# Patient Record
Sex: Female | Born: 1990 | Race: Black or African American | Hispanic: No | Marital: Single | Smoking: Never smoker
Health system: Southern US, Community
[De-identification: ages and names within clinical notes are randomized; demographics above are authoritative.]

## PROBLEM LIST (undated history)

## (undated) ENCOUNTER — Inpatient Hospital Stay (HOSPITAL_COMMUNITY): Payer: Self-pay

## (undated) ENCOUNTER — Emergency Department (HOSPITAL_COMMUNITY): Admission: EM | Payer: BLUE CROSS/BLUE SHIELD | Source: Home / Self Care

## (undated) DIAGNOSIS — S161XXS Strain of muscle, fascia and tendon at neck level, sequela: Secondary | ICD-10-CM

## (undated) DIAGNOSIS — N946 Dysmenorrhea, unspecified: Secondary | ICD-10-CM

## (undated) DIAGNOSIS — M5030 Other cervical disc degeneration, unspecified cervical region: Secondary | ICD-10-CM

## (undated) DIAGNOSIS — M51379 Other intervertebral disc degeneration, lumbosacral region without mention of lumbar back pain or lower extremity pain: Secondary | ICD-10-CM

## (undated) DIAGNOSIS — M519 Unspecified thoracic, thoracolumbar and lumbosacral intervertebral disc disorder: Secondary | ICD-10-CM

## (undated) DIAGNOSIS — N920 Excessive and frequent menstruation with regular cycle: Secondary | ICD-10-CM

## (undated) DIAGNOSIS — Z789 Other specified health status: Secondary | ICD-10-CM

## (undated) HISTORY — DX: Strain of muscle, fascia and tendon at neck level, sequela: S16.1XXS

## (undated) HISTORY — PX: NECK SURGERY: SHX720

## (undated) HISTORY — PX: NO PAST SURGERIES: SHX2092

## (undated) HISTORY — PX: BACK SURGERY: SHX140

---

## 2016-06-27 ENCOUNTER — Other Ambulatory Visit: Payer: Self-pay | Admitting: Chiropractor

## 2016-06-27 DIAGNOSIS — M5416 Radiculopathy, lumbar region: Secondary | ICD-10-CM

## 2016-06-27 DIAGNOSIS — M5412 Radiculopathy, cervical region: Secondary | ICD-10-CM

## 2016-06-30 ENCOUNTER — Ambulatory Visit
Admission: RE | Admit: 2016-06-30 | Discharge: 2016-06-30 | Disposition: A | Discharge status: Home / Self Care | Payer: Self-pay | Source: Ambulatory Visit | Attending: Chiropractor | Admitting: Chiropractor

## 2016-06-30 DIAGNOSIS — M5412 Radiculopathy, cervical region: Secondary | ICD-10-CM

## 2016-06-30 DIAGNOSIS — M5416 Radiculopathy, lumbar region: Secondary | ICD-10-CM

## 2016-07-31 ENCOUNTER — Encounter: Payer: Self-pay | Admitting: Neurology

## 2016-07-31 ENCOUNTER — Ambulatory Visit (INDEPENDENT_AMBULATORY_CARE_PROVIDER_SITE_OTHER): Payer: Medicaid Other | Admitting: Neurology

## 2016-07-31 VITALS — BP 129/80 | HR 86 | Ht 60.0 in | Wt 159.5 lb

## 2016-07-31 DIAGNOSIS — S161XXS Strain of muscle, fascia and tendon at neck level, sequela: Secondary | ICD-10-CM

## 2016-07-31 HISTORY — DX: Strain of muscle, fascia and tendon at neck level, sequela: S16.1XXS
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
## 2016-12-12 ENCOUNTER — Other Ambulatory Visit (HOSPITAL_COMMUNITY): Payer: Self-pay | Admitting: Nurse Practitioner

## 2016-12-12 DIAGNOSIS — Z3682 Encounter for antenatal screening for nuchal translucency: Secondary | ICD-10-CM

## 2016-12-12 LAB — OB RESULTS CONSOLE HEPATITIS B SURFACE ANTIGEN: HEP B S AG: NEGATIVE

## 2016-12-12 LAB — HEMOGLOBIN EVAL RFX ELECTROPHORESIS
Cystic Fibrosis Profile: NEGATIVE
Drug Screen, Urine: NEGATIVE
Glucose 1 Hour: 153
Hemoglobin Evaluation: NORMAL
Pap: NEGATIVE
Urine Culture, OB: NEGATIVE

## 2016-12-12 LAB — OB RESULTS CONSOLE RPR: RPR: NONREACTIVE

## 2016-12-12 LAB — OB RESULTS CONSOLE ABO/RH: RH TYPE: POSITIVE

## 2016-12-12 LAB — OB RESULTS CONSOLE PLATELET COUNT: Platelets: 307

## 2016-12-12 LAB — OB RESULTS CONSOLE GC/CHLAMYDIA
Chlamydia: NEGATIVE
Gonorrhea: NEGATIVE

## 2016-12-12 LAB — OB RESULTS CONSOLE HGB/HCT, BLOOD
HCT: 38
Hemoglobin: 12.2

## 2016-12-12 LAB — OB RESULTS CONSOLE HIV ANTIBODY (ROUTINE TESTING): HIV: NONREACTIVE

## 2016-12-12 LAB — OB RESULTS CONSOLE ANTIBODY SCREEN: ANTIBODY SCREEN: NEGATIVE

## 2016-12-12 LAB — OB RESULTS CONSOLE RUBELLA ANTIBODY, IGM: Rubella: IMMUNE

## 2016-12-12 LAB — OB RESULTS CONSOLE VARICELLA ZOSTER ANTIBODY, IGG: VARICELLA IGG: IMMUNE

## 2016-12-17 ENCOUNTER — Encounter (HOSPITAL_COMMUNITY): Payer: Self-pay | Admitting: *Deleted

## 2016-12-19 ENCOUNTER — Encounter (HOSPITAL_COMMUNITY): Payer: Self-pay

## 2016-12-19 ENCOUNTER — Ambulatory Visit (HOSPITAL_COMMUNITY)
Admission: RE | Admit: 2016-12-19 | Discharge: 2016-12-19 | Disposition: A | Discharge status: Home / Self Care | Payer: Medicaid Other | Source: Ambulatory Visit | Attending: Nurse Practitioner | Admitting: Nurse Practitioner

## 2016-12-19 DIAGNOSIS — Z3682 Encounter for antenatal screening for nuchal translucency: Secondary | ICD-10-CM | POA: Diagnosis not present

## 2016-12-19 DIAGNOSIS — Z3A13 13 weeks gestation of pregnancy: Secondary | ICD-10-CM | POA: Insufficient documentation

## 2016-12-19 HISTORY — DX: Other specified health status: Z78.9

## 2016-12-19 LAB — GLUCOSE, 3 HOUR GESTATIONAL

## 2016-12-23 ENCOUNTER — Other Ambulatory Visit: Payer: Self-pay

## 2017-02-08 ENCOUNTER — Inpatient Hospital Stay (HOSPITAL_COMMUNITY)
Admission: AD | Admit: 2017-02-08 | Discharge: 2017-02-08 | Disposition: A | Discharge status: Home / Self Care | Payer: Medicaid Other | Source: Ambulatory Visit | Attending: Obstetrics & Gynecology | Admitting: Obstetrics & Gynecology

## 2017-02-08 ENCOUNTER — Encounter (HOSPITAL_COMMUNITY): Payer: Self-pay

## 2017-02-08 DIAGNOSIS — K219 Gastro-esophageal reflux disease without esophagitis: Secondary | ICD-10-CM | POA: Insufficient documentation

## 2017-02-08 DIAGNOSIS — O211 Hyperemesis gravidarum with metabolic disturbance: Secondary | ICD-10-CM

## 2017-02-08 DIAGNOSIS — E860 Dehydration: Secondary | ICD-10-CM

## 2017-02-08 DIAGNOSIS — O99612 Diseases of the digestive system complicating pregnancy, second trimester: Secondary | ICD-10-CM | POA: Diagnosis not present

## 2017-02-08 DIAGNOSIS — Z3A22 22 weeks gestation of pregnancy: Secondary | ICD-10-CM | POA: Diagnosis not present

## 2017-02-08 DIAGNOSIS — R112 Nausea with vomiting, unspecified: Secondary | ICD-10-CM | POA: Diagnosis present

## 2017-02-08 LAB — URINALYSIS, ROUTINE W REFLEX MICROSCOPIC
Bilirubin Urine: NEGATIVE
Glucose, UA: NEGATIVE mg/dL
Hgb urine dipstick: NEGATIVE
Ketones, ur: 80 mg/dL — AB
Nitrite: NEGATIVE
PROTEIN: 30 mg/dL — AB
Specific Gravity, Urine: 1.024 (ref 1.005–1.030)
pH: 5 (ref 5.0–8.0)

## 2017-02-08 LAB — COMPREHENSIVE METABOLIC PANEL
ALBUMIN: 3 g/dL — AB (ref 3.5–5.0)
ALK PHOS: 52 U/L (ref 38–126)
ALT: 28 U/L (ref 14–54)
AST: 22 U/L (ref 15–41)
Anion gap: 8 (ref 5–15)
BILIRUBIN TOTAL: 0.3 mg/dL (ref 0.3–1.2)
BUN: 6 mg/dL (ref 6–20)
CHLORIDE: 104 mmol/L (ref 101–111)
CO2: 22 mmol/L (ref 22–32)
CREATININE: 0.58 mg/dL (ref 0.44–1.00)
Calcium: 8.5 mg/dL — ABNORMAL LOW (ref 8.9–10.3)
GFR calc Af Amer: >60 mL/min (ref >60)
GLUCOSE: 269 mg/dL — AB (ref 65–99)
Potassium: 3.3 mmol/L — ABNORMAL LOW (ref 3.5–5.1)
SODIUM: 134 mmol/L — AB (ref 135–145)
Total Protein: 6.2 g/dL — ABNORMAL LOW (ref 6.5–8.1)

## 2017-02-08 LAB — CBC WITH DIFFERENTIAL/PLATELET
BASOS ABS: 0 10*3/uL (ref 0.0–0.1)
Basophils Relative: 0 %
EOS PCT: 0 %
Eosinophils Absolute: 0 10*3/uL (ref 0.0–0.7)
HEMATOCRIT: 30.2 % — AB (ref 36.0–46.0)
HEMOGLOBIN: 10 g/dL — AB (ref 12.0–15.0)
LYMPHS PCT: 18 %
Lymphs Abs: 2 10*3/uL (ref 0.7–4.0)
MCH: 27.1 pg (ref 26.0–34.0)
MCHC: 33.1 g/dL (ref 30.0–36.0)
MCV: 81.8 fL (ref 78.0–100.0)
Monocytes Absolute: 0.3 10*3/uL (ref 0.1–1.0)
Monocytes Relative: 2 %
NEUTROS ABS: 8.5 10*3/uL — AB (ref 1.7–7.7)
NEUTROS PCT: 80 %
PLATELETS: 287 10*3/uL (ref 150–400)
RBC: 3.69 MIL/uL — AB (ref 3.87–5.11)
RDW: 13.5 % (ref 11.5–15.5)
WBC: 10.8 10*3/uL — AB (ref 4.0–10.5)

## 2017-02-08 MED ORDER — METOCLOPRAMIDE HCL 10 MG PO TABS: 10.0000 mg | ORAL_TABLET | Freq: Three times a day (TID) | ORAL | 1 refills | Status: DC

## 2017-02-08 MED ORDER — DEXTROSE 5 % IN LACTATED RINGERS IV BOLUS
1000.0000 mL | Freq: Once | INTRAVENOUS | Status: AC
  Administered 2017-02-08: 1000 mL via INTRAVENOUS

## 2017-02-08 MED ORDER — SODIUM CHLORIDE 0.9 % IV SOLN
8.0000 mg | Freq: Once | INTRAVENOUS | Status: AC
  Administered 2017-02-08: 8 mg via INTRAVENOUS
  Filled 2017-02-08: qty 4

## 2017-02-08 MED ORDER — METOCLOPRAMIDE HCL 5 MG/ML IJ SOLN
10.0000 mg | Freq: Once | INTRAMUSCULAR | Status: AC
  Administered 2017-02-08: 10 mg via INTRAVENOUS
  Filled 2017-02-08: qty 2

## 2017-02-08 MED ORDER — M.V.I. ADULT IV INJ
Freq: Once | INTRAVENOUS | Status: AC
  Administered 2017-02-08: 11:00:00 via INTRAVENOUS
  Filled 2017-02-08: qty 1000

## 2017-02-08 MED ORDER — RANITIDINE HCL 150 MG PO TABS: 150.0000 mg | ORAL_TABLET | Freq: Two times a day (BID) | ORAL | 1 refills | Status: DC

## 2017-02-08 MED ORDER — FAMOTIDINE IN NACL 20-0.9 MG/50ML-% IV SOLN
20.0000 mg | Freq: Once | INTRAVENOUS | Status: AC
  Administered 2017-02-08: 20 mg via INTRAVENOUS
  Filled 2017-02-08: qty 50

## 2017-02-08 MED ORDER — ONDANSETRON 8 MG PO TBDP: 8.0000 mg | ORAL_TABLET | Freq: Every day | ORAL | 1 refills | Status: DC

## 2017-02-08 NOTE — Discharge Instructions (Signed)
Eating Plan for Hyperemesis Gravidarum °Hyperemesis gravidarum is a severe form of morning sickness. Because this condition causes severe nausea and vomiting, it can lead to dehydration, malnutrition, and weight loss. One way to lessen the symptoms of nausea and vomiting is to follow the eating plan for hyperemesis gravidarum. It is often used along with prescribed medicines to control your symptoms. °What can I do to relieve my symptoms? °Listen to your body. Everyone is different and has different preferences. Find what works best for you. Take any of the following actions that are helpful to you: °· Eat and drink slowly. °· Eat 5-6 small meals daily instead of 3 large meals. °· Eat crackers before you get out of bed in the morning. °· Try having a snack in the middle of the night. °· Starchy foods are usually tolerated well. Examples include cereal, toast, bread, potatoes, pasta, rice, and pretzels. °· Ginger may help with nausea. Add ¼ tsp ground ginger to hot tea or choose ginger tea. °· Try drinking 100% fruit juice or an electrolyte drink. An electrolyte drink contains sodium, potassium, and chloride. °· Continue to take your prenatal vitamins as told by your health care provider. If you are having trouble taking your prenatal vitamins, talk with your health care provider about different options. °· Include at least 1 serving of protein with your meals and snacks. Protein options include meats or poultry, beans, nuts, eggs, and yogurt. Try eating a protein-rich snack before bed. Examples of these snacks include cheese and crackers or half of a peanut butter or turkey sandwich. °· Consider eliminating foods that trigger your symptoms. These may include spicy foods, coffee, high-fat foods, very sweet foods, and acidic foods. °· Try meals that have more protein combined with bland, salty, lower-fat, and dry foods, such as nuts, seeds, pretzels, crackers, and cereal. °· Talk with your healthcare provider about  starting a supplement of vitamin B6. °· Have fluids that are cold, clear, and carbonated or sour. Examples include lemonade, ginger ale, lemon-lime soda, ice water, and sparkling water. °· Try lemon or mint tea. °· Try brushing your teeth or using a mouth rinse after meals. ° °What should I avoid to reduce my symptoms? °Avoiding some of the following things may help reduce your symptoms. °· Foods with strong smells. Try eating meals in well-ventilated areas that are free of odors. °· Drinking water or other beverages with meals. Try not to drink anything during the 30 minutes before and after your meals. °· Drinking more than 1 cup of fluid at a time. Sometimes using a straw helps. °· Fried or high-fat foods, such as butter and cream sauces. °· Spicy foods. °· Skipping meals as best as you can. Nausea can be more intense on an empty stomach. If you cannot tolerate food at that time, do not force it. Try sucking on ice chips or other frozen items, and make up for missed calories later. °· Lying down within 2 hours after eating. °· Environmental triggers. These may include smoky rooms, closed spaces, rooms with strong smells, warm or humid places, overly loud and noisy rooms, and rooms with motion or flickering lights. °· Quick and sudden changes in your movement. ° °This information is not intended to replace advice given to you by your health care provider. Make sure you discuss any questions you have with your health care provider. °Document Released: 02/17/2007 Document Revised: 12/20/2015 Document Reviewed: 11/21/2015 °Elsevier Interactive Patient Education © 2018 [employer] ° °

## 2017-02-08 NOTE — MAU Provider Note (Signed)
History     CSN: [id number]  Arrival date and time: 02/08/17 4196   First Provider Initiated Contact with Patient 02/08/17 0930      Chief Complaint  Patient presents with  . Abdominal Pain  . Emesis   HPI  [name] is a 26 y.o. female [phone] here with ongoing N/V. States the symptoms have been present since she found out she was pregnant. States she is vomiting 5-6 times per day, every day. At times more than that. States she is taking Zofran 1 X per day, and prilosec 1 X per day and Vitamin B, and Ranadinine 2 X per day. States she is unable to keep down food or drinks.    OB History    Gravida Para Term Preterm AB Living   5 4 4     4    SAB TAB Ectopic Multiple Live Births                  Past Medical History:  Diagnosis Date  . Medical history non-contributory     Past Surgical History:  Procedure Laterality Date  . NO PAST SURGERIES      History reviewed. No pertinent family history.  Social History  Substance Use Topics  . Smoking status: Never Smoker  . Smokeless tobacco: Never Used  . Alcohol use No    Allergies: No Known Allergies  Prescriptions Prior to Admission  Medication Sig Dispense Refill Last Dose  . gabapentin (NEURONTIN) 100 MG capsule Take 100 mg by mouth at bedtime.   Not Taking  . Prenatal Vit w/Fe-Methylfol-FA (PNV PO) Take by mouth.   Taking  . sulindac (CLINORIL) 150 MG tablet Take 150 mg by mouth daily.   Not Taking  . tiZANidine (ZANAFLEX) 2 MG tablet Take 2 mg by mouth 2 (two) times daily.   Not Taking  . UNKNOWN TO PATIENT Patient taking a steroid, pain medication and anti-inflammatory but cannot remember the name   Not Taking   Results for orders placed or performed during the hospital encounter of 02/08/17 (from the past 48 hour(s))  Urinalysis, Routine w reflex microscopic     Status: Abnormal   Collection Time: 02/08/17  9:00 AM  Result Value Ref Range   Color, Urine AMBER (A) YELLOW    Comment: BIOCHEMICALS MAY BE  AFFECTED BY COLOR   APPearance HAZY (A) CLEAR   Specific Gravity, Urine 1.024 1.005 - 1.030   pH 5.0 5.0 - 8.0   Glucose, UA NEGATIVE NEGATIVE mg/dL   Hgb urine dipstick NEGATIVE NEGATIVE   Bilirubin Urine NEGATIVE NEGATIVE   Ketones, ur 80 (A) NEGATIVE mg/dL   Protein, ur 30 (A) NEGATIVE mg/dL   Nitrite NEGATIVE NEGATIVE   Leukocytes, UA MODERATE (A) NEGATIVE   RBC / HPF 6-30 0 - 5 RBC/hpf   WBC, UA 6-30 0 - 5 WBC/hpf   Bacteria, UA MANY (A) NONE SEEN   Squamous Epithelial / LPF 6-30 (A) NONE SEEN   Mucus PRESENT   CBC with Differential     Status: Abnormal   Collection Time: 02/08/17 10:33 AM  Result Value Ref Range   WBC 10.8 (H) 4.0 - 10.5 K/uL   RBC 3.69 (L) 3.87 - 5.11 MIL/uL   Hemoglobin 10.0 (L) 12.0 - 15.0 g/dL   HCT 30.2 (L) 36.0 - 46.0 %   MCV 81.8 78.0 - 100.0 fL   MCH 27.1 26.0 - 34.0 pg   MCHC 33.1 30.0 - 36.0 g/dL   RDW 13.5  11.5 - 15.5 %   Platelets 287 150 - 400 K/uL   Neutrophils Relative % 80 %   Neutro Abs 8.5 (H) 1.7 - 7.7 K/uL   Lymphocytes Relative 18 %   Lymphs Abs 2.0 0.7 - 4.0 K/uL   Monocytes Relative 2 %   Monocytes Absolute 0.3 0.1 - 1.0 K/uL   Eosinophils Relative 0 %   Eosinophils Absolute 0.0 0.0 - 0.7 K/uL   Basophils Relative 0 %   Basophils Absolute 0.0 0.0 - 0.1 K/uL  Comprehensive metabolic panel     Status: Abnormal   Collection Time: 02/08/17 10:33 AM  Result Value Ref Range   Sodium 134 (L) 135 - 145 mmol/L   Potassium 3.3 (L) 3.5 - 5.1 mmol/L   Chloride 104 101 - 111 mmol/L   CO2 22 22 - 32 mmol/L   Glucose, Bld 269 (H) 65 - 99 mg/dL   BUN 6 6 - 20 mg/dL   Creatinine, Ser 0.58 0.44 - 1.00 mg/dL   Calcium 8.5 (L) 8.9 - 10.3 mg/dL   Total Protein 6.2 (L) 6.5 - 8.1 g/dL   Albumin 3.0 (L) 3.5 - 5.0 g/dL   AST 22 15 - 41 U/L   ALT 28 14 - 54 U/L   Alkaline Phosphatase 52 38 - 126 U/L   Total Bilirubin 0.3 0.3 - 1.2 mg/dL   GFR calc non Af Amer >60 >60 mL/min   GFR calc Af Amer >60 >60 mL/min    Comment: (NOTE) The eGFR has  been calculated using the CKD EPI equation. This calculation has not been validated in all clinical situations. eGFR's persistently <60 mL/min signify possible Chronic Kidney Disease.    Anion gap 8 5 - 15    Review of Systems  Constitutional: Negative for fever.  Gastrointestinal: Positive for abdominal pain, nausea and vomiting.  Genitourinary: Negative for dysuria.  Neurological: Positive for dizziness.   Physical Exam   Blood pressure 126/72, pulse 91, temperature 98.2 F (36.8 C), temperature source Oral, resp. rate 16, last menstrual period 09/16/2016.  Physical Exam  Constitutional: She is oriented to person, place, and time. She appears well-developed and well-nourished.  Non-toxic appearance. She has a sickly appearance. No distress.  HENT:  Head: Normocephalic.  Eyes: Pupils are equal, round, and reactive to light.  Respiratory: Effort normal.  GI: Soft.  Musculoskeletal: Normal range of motion.  Neurological: She is alert and oriented to person, place, and time.  Skin: Skin is warm. She is not diaphoretic.  Psychiatric: Her behavior is normal.   MAU Course  Procedures  None  MDM  + fetal heart tones via doppler  UA shows severe dehydration  Urine culture ordered D5LR bolus X 1 MVI bolus X 1 Reglan 10 mg IV X1 Zofran 8 mg IV X 1 Pepcid 20 mg IV X 1 Patient states she is feeling much better: po challenge: PASS   Assessment and Plan   A:  1. Hyperemesis gravidarum before end of [redacted] week gestation with dehydration   2. Gastroesophageal reflux disease, esophagitis presence not specified   3. Severe dehydration     P:  Discharge home with strict return precautions Rx: Reglan, Zofran, Zantac 150 mg BID Return to MAU if symptoms worsen Small, frequent meals Increase PO fluid intake Bland meals.   Noni Saupe I, NP 02/08/2017 1:36 PM

## 2017-02-08 NOTE — MAU Note (Signed)
Reports n/v for the last couple weeks. Last week was prescribed a heartburn medicine and zofran at the doctor but it isnt working. Said shes not peeing a lot and cant eat. Cramps since Friday. No vaginal bleeding. +FM.

## 2017-02-09 LAB — CULTURE, OB URINE: Special Requests: NORMAL

## 2017-03-04 ENCOUNTER — Encounter (HOSPITAL_COMMUNITY): Payer: Self-pay

## 2017-03-31 LAB — OB RESULTS CONSOLE HGB/HCT, BLOOD
HCT: 30
HEMOGLOBIN: 9.4

## 2017-03-31 LAB — OB RESULTS CONSOLE RPR: RPR: NONREACTIVE

## 2017-04-09 ENCOUNTER — Inpatient Hospital Stay (HOSPITAL_COMMUNITY)
Admission: AD | Admit: 2017-04-09 | Discharge: 2017-04-09 | Disposition: A | Discharge status: Home / Self Care | Payer: Medicaid Other | Source: Ambulatory Visit | Attending: Obstetrics & Gynecology | Admitting: Obstetrics & Gynecology

## 2017-04-09 ENCOUNTER — Inpatient Hospital Stay (HOSPITAL_COMMUNITY): Payer: Medicaid Other

## 2017-04-09 ENCOUNTER — Encounter (HOSPITAL_COMMUNITY): Payer: Self-pay | Admitting: *Deleted

## 2017-04-09 DIAGNOSIS — O368130 Decreased fetal movements, third trimester, not applicable or unspecified: Secondary | ICD-10-CM

## 2017-04-09 DIAGNOSIS — O9989 Other specified diseases and conditions complicating pregnancy, childbirth and the puerperium: Secondary | ICD-10-CM

## 2017-04-09 DIAGNOSIS — O26899 Other specified pregnancy related conditions, unspecified trimester: Secondary | ICD-10-CM

## 2017-04-09 DIAGNOSIS — Z3A29 29 weeks gestation of pregnancy: Secondary | ICD-10-CM | POA: Insufficient documentation

## 2017-04-09 DIAGNOSIS — R109 Unspecified abdominal pain: Secondary | ICD-10-CM

## 2017-04-09 DIAGNOSIS — O23593 Infection of other part of genital tract in pregnancy, third trimester: Secondary | ICD-10-CM | POA: Diagnosis not present

## 2017-04-09 DIAGNOSIS — N898 Other specified noninflammatory disorders of vagina: Secondary | ICD-10-CM | POA: Diagnosis not present

## 2017-04-09 DIAGNOSIS — B9689 Other specified bacterial agents as the cause of diseases classified elsewhere: Secondary | ICD-10-CM

## 2017-04-09 DIAGNOSIS — O368190 Decreased fetal movements, unspecified trimester, not applicable or unspecified: Secondary | ICD-10-CM

## 2017-04-09 DIAGNOSIS — N760 Acute vaginitis: Secondary | ICD-10-CM

## 2017-04-09 DIAGNOSIS — R102 Pelvic and perineal pain: Secondary | ICD-10-CM

## 2017-04-09 LAB — URINALYSIS, ROUTINE W REFLEX MICROSCOPIC
GLUCOSE, UA: NEGATIVE mg/dL
HGB URINE DIPSTICK: NEGATIVE
Ketones, ur: 5 mg/dL — AB
NITRITE: NEGATIVE
PH: 6 (ref 5.0–8.0)
Protein, ur: 100 mg/dL — AB
RBC / HPF: NONE SEEN RBC/hpf (ref 0–5)
SPECIFIC GRAVITY, URINE: 1.027 (ref 1.005–1.030)

## 2017-04-09 LAB — WET PREP, GENITAL
SPERM: NONE SEEN
TRICH WET PREP: NONE SEEN
Yeast Wet Prep HPF POC: NONE SEEN

## 2017-04-09 LAB — POCT FERN TEST: POCT Fern Test: NEGATIVE

## 2017-04-09 MED ORDER — METRONIDAZOLE 0.75 % VA GEL: 1.0000 | Freq: Every day | VAGINAL | 0 refills | Status: AC

## 2017-04-09 NOTE — MAU Note (Signed)
Pt presents to MAU c/o ctxs, vomiting and lower abdominal pressure. Pt states the "baby is not moving like he was" pt denies bleeding. Pt states her underwear have become wet the past couple of days so she is unsure if her water is broke.

## 2017-04-09 NOTE — Discharge Instructions (Signed)
Bland Diet A bland diet consists of foods that do not have a lot of fat or fiber. Foods without fat or fiber are easier for the body to digest. They are also less likely to irritate your mouth, throat, stomach, and other parts of your gastrointestinal tract. A bland diet is sometimes called a BRAT diet. What is my plan? Your health care provider or dietitian may recommend specific changes to your diet to prevent and treat your symptoms, such as:  Eating small meals often.  Cooking food until it is soft enough to chew easily.  Chewing your food well.  Drinking fluids slowly.  Not eating foods that are very spicy, sour, or fatty.  Not eating citrus fruits, such as oranges and grapefruit.  What do I need to know about this diet?  Eat a variety of foods from the bland diet food list.  Do not follow a bland diet longer than you have to.  Ask your health care provider whether you should take vitamins. What foods can I eat? Grains  Hot cereals, such as cream of wheat. Bread, crackers, or tortillas made from refined [place] flour. Rice. Vegetables Canned or cooked vegetables. Mashed or boiled potatoes. Fruits Bananas. Applesauce. Other types of cooked or canned fruit with the skin and seeds removed, such as canned peaches or pears. Meats and Other Protein Sources Scrambled eggs. Creamy peanut butter or other nut butters. Lean, well-cooked meats, such as chicken or fish. Tofu. Soups or broths. Dairy Low-fat dairy products, such as milk, cottage cheese, or yogurt. Beverages Water. Herbal tea. Apple juice. Sweets and Desserts Pudding. Custard. Fruit gelatin. Ice cream. Fats and Oils Mild salad dressings. Canola or olive oil. The items listed above may not be a complete list of allowed foods or beverages. Contact your dietitian for more options. What foods are not recommended? Foods and ingredients that are often not recommended include:  Spicy foods, such as hot sauce or  salsa.  Fried foods.  Sour foods, such as pickled or fermented foods.  Raw vegetables or fruits, especially citrus or berries.  Caffeinated drinks.  Alcohol.  Strongly flavored seasonings or condiments.  The items listed above may not be a complete list of foods and beverages that are not allowed. Contact your dietitian for more information. This information is not intended to replace advice given to you by your health care provider. Make sure you discuss any questions you have with your health care provider. Document Released: 08/14/2015 Document Revised: 09/28/2015 Document Reviewed: 05/04/2014 Elsevier Interactive Patient Education  2018 Else[employer] Inc[employer] Bacterial Vaginosis Bacterial vaginosis is an infection of the vagina. It happens when too many germs (bacteria) grow in the vagina. This infection puts you at risk for infections from sex (STIs). Treating this infection can lower your risk for some STIs. You should also treat this if you are pregnant. It can cause your baby to be born early. Follow these instructions at home: Medicines  Take over-the-counter and prescription medicines only as told by your doctor.  Take or use your antibiotic medicine as told by your doctor. Do not stop taking or using it even if you start to feel better. General instructions  If you your sexual partner is a woman, tell her that you have this infection. She needs to get treatment if she has symptoms. If you have a female partner, he does not need to be treated.  During treatment: ? Avoid sex. ? Do not douche. ? Avoid alcohol as told. ? Avoid breastfeeding  as told.  Drink enough fluid to keep your pee (urine) clear or pale yellow.  Keep your vagina and butt (rectum) clean. ? Wash the area with warm water every day. ? Wipe from front to back after you use the toilet.  Keep all follow-up visits as told by your doctor. This is important. Preventing this condition  Do not douche.  Use only  warm water to wash around your vagina.  Use protection when you have sex. This includes: ? Latex condoms. ? Dental dams.  Limit how many people you have sex with. It is best to only have sex with the same person (be monogamous).  Get tested for STIs. Have your partner get tested.  Wear underwear that is cotton or lined with cotton.  Avoid tight pants and pantyhose. This is most important in summer.  Do not use any products that have nicotine or tobacco in them. These include cigarettes and e-cigarettes. If you need help quitting, ask your doctor.  Do not use illegal drugs.  Limit how much alcohol you drink. Contact a doctor if:  Your symptoms do not get better, even after you are treated.  You have more discharge or pain when you pee (urinate).  You have a fever.  You have pain in your belly (abdomen).  You have pain with sex.  Your bleed from your vagina between periods. Summary  This infection happens when too many germs (bacteria) grow in the vagina.  Treating this condition can lower your risk for some infections from sex (STIs).  You should also treat this if you are pregnant. It can cause early (premature) birth.  Do not stop taking or using your antibiotic medicine even if you start to feel better. This information is not intended to replace advice given to you by your health care provider. Make sure you discuss any questions you have with your health care provider. Document Released: 01/30/2008 Document Revised: 01/06/2016 Document Reviewed: 01/06/2016 Elsevier Interactive Patient Education  2017 Elsevier Inc.   Hyperemesis Gravidarum Hyperemesis gravidarum is a severe form of nausea and vomiting that happens during pregnancy. Hyperemesis is worse than morning sickness. It may cause you to have nausea or vomiting all day for many days. It may keep you from eating and drinking enough food and liquids. Hyperemesis usually occurs during the first half (the first  20 weeks) of pregnancy. It often goes away once a woman is in her second half of pregnancy. However, sometimes hyperemesis continues through an entire pregnancy. What are the causes? The cause of this condition is not known. It may be related to changes in chemicals (hormones) in the body during pregnancy, such as the high level of pregnancy hormone (human chorionic gonadotropin) or the increase in the female sex hormone (estrogen). What are the signs or symptoms? Symptoms of this condition include:  Severe nausea and vomiting.  Nausea that does not go away.  Vomiting that does not allow you to keep any food down.  Weight loss.  Body fluid loss (dehydration).  Having no desire to eat, or not liking food that you have previously enjoyed.  How is this diagnosed? This condition may be diagnosed based on:  A physical exam.  Your medical history.  Your symptoms.  Blood tests.  Urine tests.  How is this treated? This condition may be managed with medicine. If medicines to do not help relieve nausea and vomiting, you may need to receive fluids through an IV tube at the hospital. Follow these instructions  at home:  Take over-the-counter and prescription medicines only as told by your health care provider.  Avoid iron pills and multivitamins that contain iron for the first 3-4 months of pregnancy. If you take prescription iron pills, do not stop taking them unless your health care provider approves.  Take the following actions to help prevent nausea and vomiting: ? In the morning, before getting out of bed, try eating a couple of dry crackers or a piece of toast. ? Avoid foods and smells that upset your stomach. Fatty and spicy foods may make nausea worse. ? Eat 5-6 small meals a day. ? Do not drink fluids while eating meals. Drink between meals. ? Eat or suck on things that have ginger in them. Ginger can help relieve nausea. ? Avoid food preparation. The smell of food can spoil  your appetite or trigger nausea.  Follow instructions from your health care provider about eating or drinking restrictions.  For snacks, eat high-protein foods, such as cheese.  Keep all follow-up and pre-birth (prenatal) visits as told by your health care provider. This is important. Contact a health care provider if:  You have pain in your abdomen.  You have a severe headache.  You have vision problems.  You are losing weight. Get help right away if:  You cannot drink fluids without vomiting.  You vomit blood.  You have constant nausea and vomiting.  You are very weak.  You are very thirsty.  You feel dizzy.  You faint.  You have a fever or other symptoms that last for more than 2-3 days.  You have a fever and your symptoms suddenly get worse. Summary  Hyperemesis gravidarum is a severe form of nausea and vomiting that happens during pregnancy.  Making some changes to your eating habits may help relieve nausea and vomiting.  This condition may be managed with medicine.  If medicines to do not help relieve nausea and vomiting, you may need to receive fluids through an IV tube at the hospital. This information is not intended to replace advice given to you by your health care provider. Make sure you discuss any questions you have with your health care provider. Document Released: 04/22/2005 Document Revised: 12/20/2015 Document Reviewed: 12/20/2015 Elsevier Interactive Patient Education  2017 [employer]

## 2017-04-09 NOTE — MAU Provider Note (Signed)
Chief Complaint:  Emesis During Pregnancy and Contractions   None     HPI: Lauren Sweeney is a 26 y.o. [id number] at 5656w2d who presents to MAU reporting contractions and lower abdominal pressure.  Patient also endorsing vomiting.  Patient states that she is having contractions a couple times an hour.  States that she is having lower abdominal pressure that is worse.  She has a history of preterm delivery with her first daughter at 5834 weeks.  Patient also concerned because she is been having leaking.  Stating she has had having to change her underwear a couple times a day.  Denies any big gush of fluid.  Also feels like she is feeling her baby move less.  Denies any vaginal discharge or bleeding.  Pregnancy Course:  [facility]  Past Medical History: Past Medical History:  Diagnosis Date  . Medical history non-contributory     Past obstetric history: OB History  Gravida Para Term Preterm AB Living  5 4 3 1   4   SAB TAB Ectopic Multiple Live Births               # Outcome Date GA Lbr Len/2nd Weight Sex Delivery Anes PTL Lv  5 Current           4 Term      Vag-Spont     3 Term      Vag-Spont     2 Term      Vag-Spont     1 Preterm      Vag-Spont         Past Surgical History: Past Surgical History:  Procedure Laterality Date  . NO PAST SURGERIES       Family History: History reviewed. No pertinent family history.  Social History: Social History   Tobacco Use  . Smoking status: Never Smoker  . Smokeless tobacco: Never Used  Substance Use Topics  . Alcohol use: No  . Drug use: No    Allergies: No Known Allergies  Meds:  Medications Prior to Admission  Medication Sig Dispense Refill Last Dose  . Cyanocobalamin (VITAMIN B 12 PO) Take 1 tablet by mouth daily.   04/09/2017 at Unknown time  . metoCLOPramide (REGLAN) 10 MG tablet Take 1 tablet (10 mg total) by mouth 4 (four) times daily -  before meals and at bedtime. 90 tablet 1  04/09/2017 at Unknown time  . ondansetron (ZOFRAN-ODT) 8 MG disintegrating tablet Take 1 tablet (8 mg total) by mouth daily. 20 tablet 1 04/09/2017 at Unknown time  . ranitidine (ZANTAC) 150 MG tablet Take 1 tablet (150 mg total) by mouth 2 (two) times daily. 60 tablet 1 04/09/2017 at Unknown time    I have reviewed patient's Past Medical Hx, Surgical Hx, Family Hx, Social Hx, medications and allergies.   ROS:  All systems reviewed and are negative for acute change except as noted in the HPI.   Physical Exam  BP 105/61 (BP Location: Left Arm)   Pulse 81   Temp 98 F (36.7 C) (Oral)   Resp 17   LMP 09/16/2016 (Exact Date)   Constitutional: Well-developed, well-nourished female in no acute distress.  Cardiovascular: normal rate and rhythm, pulses intact Respiratory: normal rate and effort.  GI: Abd soft, non-tender, gravid appropriate for gestational age.  MS: Extremities nontender, no edema, normal ROM Neurologic: Alert and oriented x 4.  GU: Neg CVAT. Pelvic: NEFG, discharge, no blood, cervix clean and visually closed.  No pooling.  Psych: normal mood and affect  Dilation: Fingertip Effacement (%): Thick Exam by:: dr.Winna Golla   Labs: Results for orders placed or performed during the hospital encounter of 04/09/17  Wet prep, genital  Result Value Ref Range   Yeast Wet Prep HPF POC NONE SEEN NONE SEEN   Trich, Wet Prep NONE SEEN NONE SEEN   Clue Cells Wet Prep HPF POC PRESENT (A) NONE SEEN   WBC, Wet Prep HPF POC MANY (A) NONE SEEN   Sperm NONE SEEN   Urinalysis, Routine w reflex microscopic  Result Value Ref Range   Color, Urine AMBER (A) YELLOW   APPearance HAZY (A) CLEAR   Specific Gravity, Urine 1.027 1.005 - 1.030   pH 6.0 5.0 - 8.0   Glucose, UA NEGATIVE NEGATIVE mg/dL   Hgb urine dipstick NEGATIVE NEGATIVE   Bilirubin Urine SMALL (A) NEGATIVE   Ketones, ur 5 (A) NEGATIVE mg/dL   Protein, ur 960100 (A) NEGATIVE mg/dL   Nitrite NEGATIVE NEGATIVE   Leukocytes, UA SMALL  (A) NEGATIVE   RBC / HPF NONE SEEN 0 - 5 RBC/hpf   WBC, UA 6-30 0 - 5 WBC/hpf   Bacteria, UA RARE (A) NONE SEEN   Squamous Epithelial / LPF 6-30 (A) NONE SEEN   Mucus PRESENT    Ca Oxalate Crys, UA PRESENT   Fern Test  Result Value Ref Range   POCT Fern Test Negative = intact amniotic membranes   GC[facility]Chlamydia [facility] amp [facility]  Result Value Ref Range   Chlamydia Negative    Neisseria gonorrhea Negative      Imaging:  [place] Mfm Fetal Bpp Wo Non Stress  Result Date: 04/10/2017 ----------------------------------------------------------------------  OBSTETRICS REPORT                      (Signed Final 04/10/2017 11:17 am) ---------------------------------------------------------------------- Patient Info  ID #:       [id number]                          D.O.B.:  08-22-1990 (26 yrs)  Name:       Lauren Sweeney                    Visit Date: 04/09/2017 09:18 pm ---------------------------------------------------------------------- Performed By  Performed By:     Ellin SabaSusan M Kennedy        Referred By:      MAU Nursing-                    RDMS                                     MAU/Triage  Attending:        Durwin NoraJeffrey M Denney       [facility]                    MD ---------------------------------------------------------------------- Orders   #  Description                                 Code   1  US MFM FETAL BPP WO NON STRESS              [id number]  ----------------------------------------------------------------------   #  Ordered By               Order #        Accession #    Episode #   1  Caryl Ada             [id number]      [id number]     [id number]  ---------------------------------------------------------------------- Indications   [redacted] weeks gestation of pregnancy                Z3A.29   Decreased fetal movement                       O36.8190  ---------------------------------------------------------------------- OB History  Gravidity:    5         Term:   4         Prem:   0        SAB:   0  TOP:          0       Ectopic:  0        Living: 4 ---------------------------------------------------------------------- Fetal Evaluation  Num Of Fetuses:     1  Fetal Heart         151  Rate(bpm):  Cardiac Activity:   Observed  Presentation:       Cephalic  Amniotic Fluid  AFI FV:      Subjectively within normal limits  AFI Sum(cm)     %Tile       Largest Pocket(cm)  20.26           80          7.68  RUQ(cm)       RLQ(cm)       LUQ(cm)        LLQ(cm)  4.85          3.96          3.77           7.68 ---------------------------------------------------------------------- Biophysical Evaluation  Amniotic F.V:   Within normal limits       F. Tone:        Observed  F. Movement:    Observed                   Score:          8/8  F. Breathing:   Observed ---------------------------------------------------------------------- Gestational Age  LMP:           29w 2d        Date:  09/16/16                 EDD:   06/23/17  Best:          29w 2d     Det. By:  LMP  (09/16/16)          EDD:   06/23/17 ---------------------------------------------------------------------- Anatomy  Stomach:               Appears normal, left   Bladder:                Appears normal                         sided ---------------------------------------------------------------------- Cervix Uterus Adnexa  Cervix  Length:            3.9  cm.  Normal appearance by transabdominal scan. ---------------------------------------------------------------------- Impression  SIUP at [redacted]w[redacted]d (remote read of ultrasound exam  only)  active fetus  BPP 8/8 ---------------------------------------------------------------------- Recommendations  Follow up as clinically indicated. ----------------------------------------------------------------------               Durwin Nora, MD Electronically Signed Final Report   04/10/2017 11:17 am ----------------------------------------------------------------------   MAU Course: Vitals and nursing  notes reviewed I have ordered labs/imaging and reviewed them Wet prep consistent with BV Fern negative, negative pooling with speculum exam BPP 8/8  I personally reviewed the patient's NST today, found to be REACTIVE. 135 bpm, mod var, +accels, no decels. CTX: UI   MDM: Plan of care reviewed with patient, including labs and tests ordered and medical treatment.   Assessment: 1. Vaginal discharge   2. Decreased fetal movement   3. Decreased fetal movements in third trimester, single or unspecified fetus   4. Bacterial vaginosis   5. Pelvic pressure in pregnancy     Plan: Discharge home in stable condition Reassurance given Rx for Flagyl  Preterm labor precautions and fetal kick counts reviewed Handout given Follow-up with OB provider   Caryl Ada, DO [facility] 04/09/2017 8:13 PM

## 2017-04-10 LAB — GC/CHLAMYDIA PROBE AMP (~~LOC~~) NOT AT ARMC
CHLAMYDIA, DNA PROBE: NEGATIVE
NEISSERIA GONORRHEA: NEGATIVE

## 2017-05-06 ENCOUNTER — Inpatient Hospital Stay (HOSPITAL_COMMUNITY): Payer: Medicaid Other

## 2017-05-06 ENCOUNTER — Other Ambulatory Visit (HOSPITAL_COMMUNITY): Payer: Medicaid Other

## 2017-05-06 ENCOUNTER — Inpatient Hospital Stay (HOSPITAL_COMMUNITY)
Admission: AD | Admit: 2017-05-06 | Discharge: 2017-05-06 | Disposition: A | Discharge status: Home / Self Care | Payer: Medicaid Other | Source: Ambulatory Visit | Attending: Family Medicine | Admitting: Family Medicine

## 2017-05-06 ENCOUNTER — Encounter (HOSPITAL_COMMUNITY): Payer: Self-pay

## 2017-05-06 ENCOUNTER — Other Ambulatory Visit: Payer: Self-pay

## 2017-05-06 DIAGNOSIS — Z3A33 33 weeks gestation of pregnancy: Secondary | ICD-10-CM | POA: Insufficient documentation

## 2017-05-06 DIAGNOSIS — O26851 Spotting complicating pregnancy, first trimester: Secondary | ICD-10-CM

## 2017-05-06 DIAGNOSIS — O4703 False labor before 37 completed weeks of gestation, third trimester: Secondary | ICD-10-CM

## 2017-05-06 DIAGNOSIS — O26853 Spotting complicating pregnancy, third trimester: Secondary | ICD-10-CM

## 2017-05-06 DIAGNOSIS — Z3689 Encounter for other specified antenatal screening: Secondary | ICD-10-CM

## 2017-05-06 HISTORY — DX: Unspecified thoracic, thoracolumbar and lumbosacral intervertebral disc disorder: M51.9

## 2017-05-06 LAB — URINALYSIS, ROUTINE W REFLEX MICROSCOPIC
Glucose, UA: NEGATIVE mg/dL
HGB URINE DIPSTICK: NEGATIVE
Ketones, ur: NEGATIVE mg/dL
NITRITE: NEGATIVE
PH: 6 (ref 5.0–8.0)
Protein, ur: 30 mg/dL — AB
SPECIFIC GRAVITY, URINE: 1.023 (ref 1.005–1.030)

## 2017-05-06 LAB — WET PREP, GENITAL
Clue Cells Wet Prep HPF POC: NONE SEEN
Sperm: NONE SEEN
TRICH WET PREP: NONE SEEN
Yeast Wet Prep HPF POC: NONE SEEN

## 2017-05-06 LAB — POCT FERN TEST: POCT Fern Test: NEGATIVE

## 2017-05-06 LAB — FETAL FIBRONECTIN: Fetal Fibronectin: NEGATIVE

## 2017-05-06 MED ORDER — NIFEDIPINE 10 MG PO CAPS
10.0000 mg | ORAL_CAPSULE | ORAL | Status: DC | PRN
  Administered 2017-05-06 (×2): 10 mg via ORAL
  Filled 2017-05-06 (×2): qty 1

## 2017-05-06 NOTE — MAU Note (Signed)
Low abd and back pain x 2 days, feels like contractions. Bright red spotting started yesterday.  Mucus plug came out at 5 pm. Mucus like but watery also. Still feeling watery discharge. Baby moving well.
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
## 2017-05-06 NOTE — L&D Delivery Note (Signed)
Delivery Note At 11:04 AM a viable female was delivered via  (Presentation: ;  ).  APGAR9: 9, ; weight  .  pending Placenta status: normal, intact, .  Cord:  with the following complications: . none Cord pH: n/a  Anesthesia:  Epidural  Episiotomy:  no Lacerations:  no Est. Blood Loss (mL):  150 ml  Mom to postpartum.  Baby to Couplet care / Skin to Skin. Patient requests postpartum tubal ligation, 27 y.o. [id number] with undesired fertility,status post vaginal delivery, desires permanent sterilization. Risks and benefits of procedure discussed with patient including permanence of method, bleeding, infection, injury to surrounding organs and need for additional procedures. Risk failure of 0.5-1% with increased risk of ectopic gestation if pregnancy occurs was also discussed with patient. Patient verbalized understanding and all questions were answered.  Currie ParisJames G. Debroah LoopArnold MD 06/16/2017 11:38 AM

## 2017-05-06 NOTE — Discharge Instructions (Signed)
Braxton Hicks Contractions °Contractions of the uterus can occur throughout pregnancy, but they are not always a sign that you are in labor. You may have practice contractions called Braxton Hicks contractions. These false labor contractions are sometimes confused with true labor. °What are Braxton Hicks contractions? °Braxton Hicks contractions are tightening movements that occur in the muscles of the uterus before labor. Unlike true labor contractions, these contractions do not result in opening (dilation) and thinning of the cervix. Toward the end of pregnancy (32-34 weeks), Braxton Hicks contractions can happen more often and may become stronger. These contractions are sometimes difficult to tell apart from true labor because they can be very uncomfortable. You should not feel embarrassed if you go to the hospital with false labor. °Sometimes, the only way to tell if you are in true labor is for your health care provider to look for changes in the cervix. The health care provider will do a physical exam and may monitor your contractions. If you are not in true labor, the exam should show that your cervix is not dilating and your water has not broken. °If there are other health problems associated with your pregnancy, it is completely safe for you to be sent home with false labor. You may continue to have Braxton Hicks contractions until you go into true labor. °How to tell the difference between true labor and false labor °True labor °· Contractions last 30-70 seconds. °· Contractions become very regular. °· Discomfort is usually felt in the top of the uterus, and it spreads to the lower abdomen and low back. °· Contractions do not go away with walking. °· Contractions usually become more intense and increase in frequency. °· The cervix dilates and gets thinner. °False labor °· Contractions are usually shorter and not as strong as true labor contractions. °· Contractions are usually irregular. °· Contractions  are often felt in the front of the lower abdomen and in the groin. °· Contractions may go away when you walk around or change positions while lying down. °· Contractions get weaker and are shorter-lasting as time goes on. °· The cervix usually does not dilate or become thin. °Follow these instructions at home: °· Take over-the-counter and prescription medicines only as told by your health care provider. °· Keep up with your usual exercises and follow other instructions from your health care provider. °· Eat and drink lightly if you think you are going into labor. °· If Braxton Hicks contractions are making you uncomfortable: °? Change your position from lying down or resting to walking, or change from walking to resting. °? Sit and rest in a tub of warm water. °? Drink enough fluid to keep your urine pale yellow. Dehydration may cause these contractions. °? Do slow and deep breathing several times an hour. °· Keep all follow-up prenatal visits as told by your health care provider. This is important. °Contact a health care provider if: °· You have a fever. °· You have continuous pain in your abdomen. °Get help right away if: °· Your contractions become stronger, more regular, and closer together. °· You have fluid leaking or gushing from your vagina. °· You pass blood-tinged mucus (bloody show). °· You have bleeding from your vagina. °· You have low back pain that you never had before. °· You feel your baby’s head pushing down and causing pelvic pressure. °· Your baby is not moving inside you as much as it used to. °Summary °· Contractions that occur before labor are called Braxton   Hicks contractions, false labor, or practice contractions. °· Braxton Hicks contractions are usually shorter, weaker, farther apart, and less regular than true labor contractions. True labor contractions usually become progressively stronger and regular and they become more frequent. °· Manage discomfort from Braxton Hicks contractions by  changing position, resting in a warm bath, drinking plenty of water, or practicing deep breathing. °This information is not intended to replace advice given to you by your health care provider. Make sure you discuss any questions you have with your health care provider. °Document Released: 09/05/2016 Document Revised: 09/05/2016 Document Reviewed: 09/05/2016 °Elsevier Interactive Patient Education © 2018 [employer] ° °

## 2017-05-09 LAB — CULTURE, OB URINE

## 2017-05-10 ENCOUNTER — Other Ambulatory Visit: Payer: Self-pay | Admitting: Obstetrics and Gynecology

## 2017-05-10 MED ORDER — CEPHALEXIN 500 MG PO CAPS: 500.0000 mg | ORAL_CAPSULE | Freq: Four times a day (QID) | ORAL | 0 refills | Status: AC

## 2017-05-10 NOTE — Progress Notes (Signed)
Patient treated for UTI

## 2017-05-22 ENCOUNTER — Ambulatory Visit (INDEPENDENT_AMBULATORY_CARE_PROVIDER_SITE_OTHER): Payer: Medicaid Other | Admitting: Obstetrics & Gynecology

## 2017-05-22 VITALS — BP 126/78 | HR 84 | Wt 147.8 lb

## 2017-05-22 DIAGNOSIS — O210 Mild hyperemesis gravidarum: Secondary | ICD-10-CM

## 2017-05-22 DIAGNOSIS — Z3403 Encounter for supervision of normal first pregnancy, third trimester: Secondary | ICD-10-CM | POA: Diagnosis not present

## 2017-05-22 DIAGNOSIS — O0993 Supervision of high risk pregnancy, unspecified, third trimester: Secondary | ICD-10-CM | POA: Insufficient documentation

## 2017-05-22 LAB — POCT URINALYSIS DIP (DEVICE)
BILIRUBIN URINE: NEGATIVE
GLUCOSE, UA: NEGATIVE mg/dL
HGB URINE DIPSTICK: NEGATIVE
Ketones, ur: NEGATIVE mg/dL
NITRITE: NEGATIVE
Protein, ur: 30 mg/dL — AB
Specific Gravity, Urine: 1.02 (ref 1.005–1.030)
UROBILINOGEN UA: 1 mg/dL (ref 0.0–1.0)
pH: 6.5 (ref 5.0–8.0)

## 2017-05-22 NOTE — Patient Instructions (Signed)
Return to clinic for any scheduled appointments or obstetric concerns, or go to MAU for evaluation  

## 2017-05-22 NOTE — Progress Notes (Signed)
   PRENATAL VISIT NOTE  Subjective:  Lauren Sweeney is a 27 y.o. [id number] at 291w4d being seen today for transfer of prenatal care.  Patient has a long history of HEG refractory to Zofran, multiple antiemetics, and PPIs but nothing is relieving her symptoms according to her. She said this happened during last three pregnancies and culminated in induction of labor at 37 weeks in [place] due to the concern of the physician about her mental state.  No history of LBW or other co-morbidities.  [facility] was 54%. Had a resolved marginal placental previa early in the third trimester.   Patient reports heartburn, nausea and vomiting.  Contractions: Irregular. Vag. Bleeding: None.  Movement: Present. Denies leaking of fluid.   The following portions of the patient's history were reviewed and updated as appropriate: allergies, current medications, past family history, past medical history, past social history, past surgical history and problem list. Problem list updated.  Objective:   Vitals:   05/22/17 1543  BP: 126/78  Pulse: 84  Weight: 147 lb 12.8 oz (67 kg)    Fetal Status: Fetal Heart Rate (bpm): 153 Fundal Height: 35 cm Movement: Present  Presentation: Vertex  General:  Alert, oriented and cooperative. Patient is in no acute distress.  Skin: Skin is warm and dry. No rash noted.   Cardiovascular: Normal heart rate noted  Respiratory: Normal respiratory effort, no problems with respiration noted  Abdomen: Soft, gravid, appropriate for gestational age.  Pain/Pressure: Present     Pelvic: Cervical exam performed Dilation: 1 Effacement (%): 50 Station: -2  Extremities: Normal range of motion.  Edema: None  Mental Status:  Normal mood and affect. Normal behavior. Normal judgment and thought content.   Assessment and Plan:  Pregnancy: [id number] at 111w4d  1. Hyperemesis gravidarum, antepartum Patient was told there was no medical indication currently for IOL at 37 weeks; 39  weeks can be considered given her favorable cervix.  She burst into tears saying that she "cannot lie around all day vomiting" and "why can't we do what her other doctors did".  Her FOB who accompanied her was also not very happy about the denial of IOL a 37 weeks.  She is depressed, no HI/SI.  She is insisting on being induced and desires referral to high-risk specialist.  MFM referral made for patient, will follow up recommendations. - AMB referral to maternal fetal medicine  2. Encounter for supervision of normal first pregnancy in third trimester Preterm labor symptoms and general obstetric precautions including but not limited to vaginal bleeding, contractions, leaking of fluid and fetal movement were reviewed in detail with the patient. Please refer to After Visit Summary for other counseling recommendations.  Return in about 1 week (around 05/29/2017) for Pelvic cultures, OB Visit.   Jaynie CollinsUgonna Ameilia Rattan, MD

## 2017-05-23 ENCOUNTER — Encounter (HOSPITAL_COMMUNITY): Payer: Self-pay

## 2017-05-23 ENCOUNTER — Ambulatory Visit (HOSPITAL_COMMUNITY)
Admission: RE | Admit: 2017-05-23 | Discharge: 2017-05-23 | Disposition: A | Discharge status: Home / Self Care | Payer: Medicaid Other | Source: Ambulatory Visit | Attending: Obstetrics & Gynecology | Admitting: Obstetrics & Gynecology

## 2017-05-23 ENCOUNTER — Encounter: Payer: Self-pay | Admitting: Obstetrics & Gynecology

## 2017-05-23 DIAGNOSIS — O26893 Other specified pregnancy related conditions, third trimester: Secondary | ICD-10-CM | POA: Diagnosis not present

## 2017-05-23 DIAGNOSIS — E860 Dehydration: Secondary | ICD-10-CM | POA: Diagnosis not present

## 2017-05-23 DIAGNOSIS — Z3A35 35 weeks gestation of pregnancy: Secondary | ICD-10-CM | POA: Insufficient documentation

## 2017-05-23 DIAGNOSIS — M549 Dorsalgia, unspecified: Secondary | ICD-10-CM | POA: Insufficient documentation

## 2017-05-23 DIAGNOSIS — O210 Mild hyperemesis gravidarum: Secondary | ICD-10-CM | POA: Diagnosis not present
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
## 2017-05-24 LAB — URINE CULTURE, OB REFLEX

## 2017-05-24 LAB — CULTURE, OB URINE

## 2017-05-27 ENCOUNTER — Encounter: Payer: Self-pay | Admitting: *Deleted

## 2017-06-02 ENCOUNTER — Other Ambulatory Visit (HOSPITAL_COMMUNITY)
Admission: RE | Admit: 2017-06-02 | Discharge: 2017-06-02 | Disposition: A | Discharge status: Home / Self Care | Payer: Medicaid Other | Source: Ambulatory Visit | Attending: Obstetrics and Gynecology | Admitting: Obstetrics and Gynecology

## 2017-06-02 ENCOUNTER — Ambulatory Visit (INDEPENDENT_AMBULATORY_CARE_PROVIDER_SITE_OTHER): Payer: Medicaid Other | Admitting: Obstetrics and Gynecology

## 2017-06-02 VITALS — BP 109/66 | HR 92 | Wt 147.3 lb

## 2017-06-02 DIAGNOSIS — Z3483 Encounter for supervision of other normal pregnancy, third trimester: Secondary | ICD-10-CM | POA: Diagnosis not present

## 2017-06-02 DIAGNOSIS — O210 Mild hyperemesis gravidarum: Secondary | ICD-10-CM

## 2017-06-02 DIAGNOSIS — Z3480 Encounter for supervision of other normal pregnancy, unspecified trimester: Secondary | ICD-10-CM | POA: Diagnosis not present

## 2017-06-02 LAB — OB RESULTS CONSOLE GBS: GBS: NEGATIVE

## 2017-06-02 NOTE — Progress Notes (Signed)
   PRENATAL VISIT NOTE  Subjective:  Lauren Sweeney is a 27 y.o. [id number] at 1673w0d being seen today for ongoing prenatal care.  She is currently monitored for the following issues for this low-risk pregnancy and has Hyperemesis gravidarum, antepartum and Encounter for supervision of other normal pregnancy, unspecified trimester on their problem list.  Patient reports pelvic pressure and cramping, mucous plug came out.  Contractions: Irregular. Vag. Bleeding: None.  Movement: Present. Denies leaking of fluid.   The following portions of the patient's history were reviewed and updated as appropriate: allergies, current medications, past family history, past medical history, past social history, past surgical history and problem list. Problem list updated.  Objective:   Vitals:   06/02/17 0907  BP: 109/66  Pulse: 92  Weight: 147 lb 4.8 oz (66.8 kg)    Fetal Status: Fetal Heart Rate (bpm): 145 Fundal Height: 37 cm Movement: Present     General:  Alert, oriented and cooperative. Patient is in no acute distress.  Skin: Skin is warm and dry. No rash noted.   Cardiovascular: Normal heart rate noted  Respiratory: Normal respiratory effort, no problems with respiration noted  Abdomen: Soft, gravid, appropriate for gestational age.  Pain/Pressure: Present     Pelvic: Cervical exam deferred      2/50/-2  Extremities: Normal range of motion.  Edema: None  Mental Status:  Normal mood and affect. Normal behavior. Normal judgment and thought content.   Assessment and Plan:  Pregnancy: [id number] at 6473w0d  1. Encounter for supervision of other normal pregnancy, unspecified trimester For BTL, reaffirms this today - Cervicovaginal ancillary only - Culture, beta strep (group b only)  2. Hyperemesis gravidarum, antepartum On zantac but not taking properly, reviewed BID dosing On pyridoxine BID Requesting IOL at 39 weeks for nausea. Reviewed risks/benefits of IOL prior to term, patient and husband are  insistent that she cannot go longer. Reviewed that it will be considered if she has a favorable cervix. Exam more favorable than last week, BS 8 today. Reviewed instructions to go to MAU with labor  Term labor symptoms and general obstetric precautions including but not limited to vaginal bleeding, contractions, leaking of fluid and fetal movement were reviewed in detail with the patient. Please refer to After Visit Summary for other counseling recommendations.  Return in about 1 week (around 06/09/2017) for OB visit.   Conan BowensKelly M Davis, MD

## 2017-06-02 NOTE — Patient Instructions (Signed)

## 2017-06-03 LAB — CERVICOVAGINAL ANCILLARY ONLY
CHLAMYDIA, DNA PROBE: NEGATIVE
Neisseria Gonorrhea: NEGATIVE

## 2017-06-06 LAB — CULTURE, BETA STREP (GROUP B ONLY): STREP GP B CULTURE: NEGATIVE

## 2017-06-09 ENCOUNTER — Encounter: Payer: Self-pay | Admitting: Obstetrics and Gynecology

## 2017-06-09 ENCOUNTER — Ambulatory Visit (INDEPENDENT_AMBULATORY_CARE_PROVIDER_SITE_OTHER): Payer: Medicaid Other | Admitting: Obstetrics and Gynecology

## 2017-06-09 ENCOUNTER — Encounter (HOSPITAL_COMMUNITY): Payer: Self-pay | Admitting: *Deleted

## 2017-06-09 ENCOUNTER — Telehealth (HOSPITAL_COMMUNITY): Payer: Self-pay | Admitting: *Deleted

## 2017-06-09 VITALS — BP 108/71 | HR 85 | Wt 148.5 lb

## 2017-06-09 DIAGNOSIS — O210 Mild hyperemesis gravidarum: Secondary | ICD-10-CM | POA: Diagnosis not present

## 2017-06-09 DIAGNOSIS — O0993 Supervision of high risk pregnancy, unspecified, third trimester: Secondary | ICD-10-CM

## 2017-06-09 NOTE — Telephone Encounter (Signed)
Preadmission screen  

## 2017-06-09 NOTE — Progress Notes (Signed)
Prenatal Visit Note Date: 06/09/2017 Clinic: [facility]  Subjective:  Lauren Sweeney is a [age] y.o. [id number] at 5296w0d being seen today for ongoing prenatal care.  She is currently monitored for the following issues for this high-risk pregnancy and has Hyperemesis gravidarum, antepartum and Supervision of high risk pregnancy, antepartum, third trimester on their problem list.  Patient reports persistent n/v.   Contractions: Irregular. Vag. Bleeding: None.  Movement: Present. Denies leaking of fluid.   The following portions of the patient's history were reviewed and updated as appropriate: allergies, current medications, past family history, past medical history, past social history, past surgical history and problem list. Problem list updated.  Objective:   Vitals:   06/09/17 0823  BP: 108/71  Pulse: 85  Weight: 148 lb 8 oz (67.4 kg)    Fetal Status: Fetal Heart Rate (bpm): 142 Fundal Height: 37 cm Movement: Present  Presentation: Vertex  General:  Alert, oriented and cooperative. Patient is in no acute distress.  Skin: Skin is warm and dry. No rash noted.   Cardiovascular: Normal heart rate noted  Respiratory: Normal respiratory effort, no problems with respiration noted  Abdomen: Soft, gravid, appropriate for gestational age. Pain/Pressure: Present     Pelvic:  Cervical exam performed Dilation: 2-3 Effacement (%): 50 Station: -2/midposition/soft  Extremities: Normal range of motion.  Edema: None  Mental Status: Normal mood and affect. Normal behavior. Normal judgment and thought content.   Urinalysis:      Assessment and Plan:  Pregnancy: [id number] at 7596w0d  1. Supervision of high risk pregnancy, antepartum, third trimester Routine care. BTL papers UTD and pt still desires.   2. Hyperemesis gravidarum, antepartum Has last about [facility]. Confirms bid zantac, bid b6 and bid b12. D/w group and okay with 39wk iol, which is what she desires.  Bishop score b/w 5-7. For 2/11 0730 iol. Either cyto vs pit iol d/w pt.   Term labor symptoms and general obstetric precautions including but not limited to vaginal bleeding, contractions, leaking of fluid and fetal movement were reviewed in detail with the patient. Please refer to After Visit Summary for other counseling recommendations.  Return in about 5 weeks (around 07/14/2017) for pp visit.   [place] BingPickens, Delyle Weider, MD

## 2017-06-10 ENCOUNTER — Encounter: Payer: Self-pay | Admitting: *Deleted

## 2017-06-11 ENCOUNTER — Other Ambulatory Visit: Payer: Self-pay | Admitting: Advanced Practice Midwife

## 2017-06-16 ENCOUNTER — Encounter: Payer: Medicaid Other | Admitting: Obstetrics and Gynecology

## 2017-06-16 ENCOUNTER — Encounter (HOSPITAL_COMMUNITY)
Admission: RE | Disposition: A | Discharge status: Home / Self Care | Payer: Self-pay | Source: Ambulatory Visit | Attending: Obstetrics & Gynecology

## 2017-06-16 ENCOUNTER — Inpatient Hospital Stay (HOSPITAL_COMMUNITY): Payer: Medicaid Other | Admitting: Anesthesiology

## 2017-06-16 ENCOUNTER — Encounter (HOSPITAL_COMMUNITY): Payer: Self-pay

## 2017-06-16 ENCOUNTER — Inpatient Hospital Stay (HOSPITAL_COMMUNITY)
Admission: RE | Admit: 2017-06-16 | Discharge: 2017-06-17 | DRG: 798 | Disposition: A | Discharge status: Home / Self Care | Payer: Medicaid Other | Source: Ambulatory Visit | Attending: Obstetrics & Gynecology | Admitting: Obstetrics & Gynecology

## 2017-06-16 DIAGNOSIS — Z3A39 39 weeks gestation of pregnancy: Secondary | ICD-10-CM

## 2017-06-16 DIAGNOSIS — O0993 Supervision of high risk pregnancy, unspecified, third trimester: Secondary | ICD-10-CM

## 2017-06-16 DIAGNOSIS — Z302 Encounter for sterilization: Secondary | ICD-10-CM

## 2017-06-16 DIAGNOSIS — O211 Hyperemesis gravidarum with metabolic disturbance: Secondary | ICD-10-CM | POA: Diagnosis present

## 2017-06-16 DIAGNOSIS — O210 Mild hyperemesis gravidarum: Secondary | ICD-10-CM | POA: Diagnosis present

## 2017-06-16 HISTORY — PX: TUBAL LIGATION: SHX77

## 2017-06-16 LAB — CBC
HCT: 28.1 % — ABNORMAL LOW (ref 36.0–46.0)
HEMOGLOBIN: 8.7 g/dL — AB (ref 12.0–15.0)
MCH: 23.4 pg — AB (ref 26.0–34.0)
MCHC: 31 g/dL (ref 30.0–36.0)
MCV: 75.5 fL — ABNORMAL LOW (ref 78.0–100.0)
PLATELETS: 345 10*3/uL (ref 150–400)
RBC: 3.72 MIL/uL — ABNORMAL LOW (ref 3.87–5.11)
RDW: 16.2 % — ABNORMAL HIGH (ref 11.5–15.5)
WBC: 9.8 10*3/uL (ref 4.0–10.5)

## 2017-06-16 LAB — TYPE AND SCREEN
ABO/RH(D): O POS
Antibody Screen: NEGATIVE

## 2017-06-16 LAB — ABO/RH: ABO/RH(D): O POS

## 2017-06-16 LAB — RPR: RPR Ser Ql: NONREACTIVE

## 2017-06-16 SURGERY — LIGATION, FALLOPIAN TUBE, POSTPARTUM
Anesthesia: Epidural

## 2017-06-16 MED ORDER — LIDOCAINE HCL (PF) 1 % IJ SOLN
INTRAMUSCULAR | Status: DC | PRN
  Administered 2017-06-16 (×2): 5 mL

## 2017-06-16 MED ORDER — LIDOCAINE-EPINEPHRINE (PF) 2 %-1:200000 IJ SOLN
INTRAMUSCULAR | Status: AC
  Filled 2017-06-16: qty 20

## 2017-06-16 MED ORDER — MIDAZOLAM HCL 5 MG/5ML IJ SOLN
INTRAMUSCULAR | Status: DC | PRN
  Administered 2017-06-16: 2 mg via INTRAVENOUS

## 2017-06-16 MED ORDER — PHENYLEPHRINE 40 MCG/ML (10ML) SYRINGE FOR IV PUSH (FOR BLOOD PRESSURE SUPPORT): 80.0000 ug | PREFILLED_SYRINGE | INTRAVENOUS | Status: DC | PRN

## 2017-06-16 MED ORDER — LACTATED RINGERS IV SOLN: 500.0000 mL | Freq: Once | INTRAVENOUS | Status: DC

## 2017-06-16 MED ORDER — OXYTOCIN 40 UNITS IN LACTATED RINGERS INFUSION - SIMPLE MED: 2.5000 [IU]/h | INTRAVENOUS | Status: DC

## 2017-06-16 MED ORDER — ACETAMINOPHEN 325 MG PO TABS
650.0000 mg | ORAL_TABLET | ORAL | Status: DC | PRN
  Administered 2017-06-16: 650 mg via ORAL
  Filled 2017-06-16: qty 2

## 2017-06-16 MED ORDER — DIPHENHYDRAMINE HCL 25 MG PO CAPS: 25.0000 mg | ORAL_CAPSULE | Freq: Four times a day (QID) | ORAL | Status: DC | PRN

## 2017-06-16 MED ORDER — FENTANYL 2.5 MCG/ML BUPIVACAINE 1/10 % EPIDURAL INFUSION (WH - ANES)
14.0000 mL/h | INTRAMUSCULAR | Status: DC | PRN
  Administered 2017-06-16: 14 mL/h via EPIDURAL
  Filled 2017-06-16: qty 100

## 2017-06-16 MED ORDER — EPHEDRINE 5 MG/ML INJ: 10.0000 mg | INTRAVENOUS | Status: DC | PRN

## 2017-06-16 MED ORDER — BENZOCAINE-MENTHOL 20-0.5 % EX AERO
1.0000 "application " | INHALATION_SPRAY | CUTANEOUS | Status: DC | PRN
  Filled 2017-06-16: qty 56

## 2017-06-16 MED ORDER — LIDOCAINE HCL (PF) 1 % IJ SOLN: 30.0000 mL | INTRAMUSCULAR | Status: DC | PRN

## 2017-06-16 MED ORDER — DIBUCAINE 1 % RE OINT: 1.0000 "application " | TOPICAL_OINTMENT | RECTAL | Status: DC | PRN

## 2017-06-16 MED ORDER — LACTATED RINGERS IV SOLN: 500.0000 mL | INTRAVENOUS | Status: DC | PRN

## 2017-06-16 MED ORDER — MEPERIDINE HCL 25 MG/ML IJ SOLN: 6.2500 mg | INTRAMUSCULAR | Status: DC | PRN

## 2017-06-16 MED ORDER — PRENATAL MULTIVITAMIN CH
1.0000 | ORAL_TABLET | Freq: Every day | ORAL | Status: DC
  Administered 2017-06-17: 1 via ORAL
  Filled 2017-06-16: qty 1

## 2017-06-16 MED ORDER — SENNOSIDES-DOCUSATE SODIUM 8.6-50 MG PO TABS
2.0000 | ORAL_TABLET | ORAL | Status: DC
  Administered 2017-06-16: 2 via ORAL
  Filled 2017-06-16: qty 2

## 2017-06-16 MED ORDER — FENTANYL CITRATE (PF) 100 MCG/2ML IJ SOLN
INTRAMUSCULAR | Status: AC
  Filled 2017-06-16: qty 2

## 2017-06-16 MED ORDER — MIDAZOLAM HCL 2 MG/2ML IJ SOLN
INTRAMUSCULAR | Status: AC
  Filled 2017-06-16: qty 2

## 2017-06-16 MED ORDER — TETANUS-DIPHTH-ACELL PERTUSSIS 5-2.5-18.5 LF-MCG/0.5 IM SUSP: 0.5000 mL | Freq: Once | INTRAMUSCULAR | Status: DC

## 2017-06-16 MED ORDER — ONDANSETRON HCL 4 MG/2ML IJ SOLN
4.0000 mg | Freq: Four times a day (QID) | INTRAMUSCULAR | Status: DC | PRN
  Administered 2017-06-16: 4 mg via INTRAVENOUS

## 2017-06-16 MED ORDER — ACETAMINOPHEN 325 MG PO TABS: 650.0000 mg | ORAL_TABLET | ORAL | Status: DC | PRN

## 2017-06-16 MED ORDER — ONDANSETRON HCL 4 MG/2ML IJ SOLN: 4.0000 mg | INTRAMUSCULAR | Status: DC | PRN

## 2017-06-16 MED ORDER — BUPIVACAINE HCL (PF) 0.25 % IJ SOLN
INTRAMUSCULAR | Status: DC | PRN
  Administered 2017-06-16: 10 mL

## 2017-06-16 MED ORDER — SODIUM BICARBONATE 8.4 % IV SOLN
INTRAVENOUS | Status: DC | PRN
  Administered 2017-06-16: 5 mL via EPIDURAL
  Administered 2017-06-16: 10 mL via EPIDURAL

## 2017-06-16 MED ORDER — METOCLOPRAMIDE HCL 5 MG/ML IJ SOLN: 10.0000 mg | Freq: Once | INTRAMUSCULAR | Status: DC | PRN

## 2017-06-16 MED ORDER — DIPHENHYDRAMINE HCL 50 MG/ML IJ SOLN: 12.5000 mg | INTRAMUSCULAR | Status: DC | PRN

## 2017-06-16 MED ORDER — OXYCODONE-ACETAMINOPHEN 5-325 MG PO TABS: 2.0000 | ORAL_TABLET | ORAL | Status: DC | PRN

## 2017-06-16 MED ORDER — TERBUTALINE SULFATE 1 MG/ML IJ SOLN: 0.2500 mg | Freq: Once | INTRAMUSCULAR | Status: DC | PRN

## 2017-06-16 MED ORDER — BUPIVACAINE HCL (PF) 0.25 % IJ SOLN
INTRAMUSCULAR | Status: AC
  Filled 2017-06-16: qty 30

## 2017-06-16 MED ORDER — LACTATED RINGERS IV SOLN: INTRAVENOUS | Status: DC

## 2017-06-16 MED ORDER — SIMETHICONE 80 MG PO CHEW: 80.0000 mg | CHEWABLE_TABLET | ORAL | Status: DC | PRN

## 2017-06-16 MED ORDER — COCONUT OIL OIL: 1.0000 "application " | TOPICAL_OIL | Status: DC | PRN

## 2017-06-16 MED ORDER — OXYTOCIN 40 UNITS IN LACTATED RINGERS INFUSION - SIMPLE MED
1.0000 m[IU]/min | INTRAVENOUS | Status: DC
  Administered 2017-06-16: 2 m[IU]/min via INTRAVENOUS
  Administered 2017-06-16: 400 mL via INTRAVENOUS
  Filled 2017-06-16: qty 1000

## 2017-06-16 MED ORDER — LACTATED RINGERS IV SOLN
INTRAVENOUS | Status: DC
  Administered 2017-06-16 (×2): via INTRAVENOUS

## 2017-06-16 MED ORDER — OXYTOCIN BOLUS FROM INFUSION: 500.0000 mL | Freq: Once | INTRAVENOUS | Status: DC

## 2017-06-16 MED ORDER — IBUPROFEN 600 MG PO TABS
600.0000 mg | ORAL_TABLET | Freq: Four times a day (QID) | ORAL | Status: DC
  Administered 2017-06-16 – 2017-06-17 (×4): 600 mg via ORAL
  Filled 2017-06-16 (×4): qty 1

## 2017-06-16 MED ORDER — SOD CITRATE-CITRIC ACID 500-334 MG/5ML PO SOLN
30.0000 mL | ORAL | Status: DC | PRN
  Administered 2017-06-16: 30 mL via ORAL
  Filled 2017-06-16: qty 15

## 2017-06-16 MED ORDER — FENTANYL CITRATE (PF) 100 MCG/2ML IJ SOLN: 100.0000 ug | INTRAMUSCULAR | Status: DC | PRN

## 2017-06-16 MED ORDER — ONDANSETRON HCL 4 MG PO TABS: 4.0000 mg | ORAL_TABLET | ORAL | Status: DC | PRN

## 2017-06-16 MED ORDER — FENTANYL CITRATE (PF) 100 MCG/2ML IJ SOLN
INTRAMUSCULAR | Status: DC | PRN
  Administered 2017-06-16: 100 ug via INTRAVENOUS

## 2017-06-16 MED ORDER — PHENYLEPHRINE 40 MCG/ML (10ML) SYRINGE FOR IV PUSH (FOR BLOOD PRESSURE SUPPORT)
80.0000 ug | PREFILLED_SYRINGE | INTRAVENOUS | Status: DC | PRN
  Filled 2017-06-16: qty 10

## 2017-06-16 MED ORDER — BUPIVACAINE HCL (PF) 0.25 % IJ SOLN
INTRAMUSCULAR | Status: AC
Start: 2017-06-16 — End: 2017-06-16
  Filled 2017-06-16: qty 10

## 2017-06-16 MED ORDER — OXYCODONE HCL 5 MG PO TABS
5.0000 mg | ORAL_TABLET | Freq: Four times a day (QID) | ORAL | Status: DC | PRN
  Administered 2017-06-16: 10 mg via ORAL
  Filled 2017-06-16: qty 2

## 2017-06-16 MED ORDER — OXYCODONE-ACETAMINOPHEN 5-325 MG PO TABS: 1.0000 | ORAL_TABLET | ORAL | Status: DC | PRN

## 2017-06-16 MED ORDER — WITCH HAZEL-GLYCERIN EX PADS: 1.0000 "application " | MEDICATED_PAD | CUTANEOUS | Status: DC | PRN

## 2017-06-16 MED ORDER — ZOLPIDEM TARTRATE 5 MG PO TABS: 5.0000 mg | ORAL_TABLET | Freq: Every evening | ORAL | Status: DC | PRN

## 2017-06-16 MED ORDER — FENTANYL CITRATE (PF) 100 MCG/2ML IJ SOLN: 25.0000 ug | INTRAMUSCULAR | Status: DC | PRN

## 2017-06-16 SURGICAL SUPPLY — 24 items
BLADE SURG 11 STRL SS (BLADE) ×3 IMPLANT
CLIP FILSHIE TUBAL LIGA STRL (Clip) ×3 IMPLANT
CLOTH BEACON ORANGE TIMEOUT ST (SAFETY) ×3 IMPLANT
DERMABOND ADVANCED (GAUZE/BANDAGES/DRESSINGS) ×2
DERMABOND ADVANCED .7 DNX12 (GAUZE/BANDAGES/DRESSINGS) ×1 IMPLANT
DRSG OPSITE POSTOP 3X4 (GAUZE/BANDAGES/DRESSINGS) ×3 IMPLANT
DURAPREP 26ML APPLICATOR (WOUND CARE) ×3 IMPLANT
GLOVE BIO SURGEON STRL SZ 6.5 (GLOVE) ×2 IMPLANT
GLOVE BIO SURGEONS STRL SZ 6.5 (GLOVE) ×1
GLOVE BIOGEL PI IND STRL 7.0 (GLOVE) ×2 IMPLANT
GLOVE BIOGEL PI INDICATOR 7.0 (GLOVE) ×4
GOWN STRL REUS W/TWL LRG LVL3 (GOWN DISPOSABLE) ×6 IMPLANT
NEEDLE HYPO 22GX1.5 SAFETY (NEEDLE) ×3 IMPLANT
NS IRRIG 1000ML POUR BTL (IV SOLUTION) ×3 IMPLANT
PACK ABDOMINAL MINOR (CUSTOM PROCEDURE TRAY) ×3 IMPLANT
PROTECTOR NERVE ULNAR (MISCELLANEOUS) ×3 IMPLANT
SPONGE LAP 4X18 X RAY DECT (DISPOSABLE) IMPLANT
SUT VIC AB 0 CT1 27 (SUTURE) ×2
SUT VIC AB 0 CT1 27XBRD ANBCTR (SUTURE) ×1 IMPLANT
SUT VICRYL 4-0 PS2 18IN ABS (SUTURE) ×3 IMPLANT
SYR CONTROL 10ML LL (SYRINGE) ×3 IMPLANT
TOWEL OR 17X24 6PK STRL BLUE (TOWEL DISPOSABLE) ×6 IMPLANT
TRAY FOLEY CATH SILVER 16FR (SET/KITS/TRAYS/PACK) ×3 IMPLANT
WATER STERILE IRR 1000ML POUR (IV SOLUTION) ×3 IMPLANT

## 2017-06-16 NOTE — Op Note (Signed)
Lauren Sweeney 06/16/2017  PREOPERATIVE DIAGNOSES: Multiparity, undesired fertility  POSTOPERATIVE DIAGNOSES: Multiparity, undesired fertility  PROCEDURE:  Postpartum Bilateral Tubal Sterilization using Filshie Clips   SURGEON: Dr. Scheryl DarterJames [name] and Dr. Caryl AdaJazma Atoya Andrew  ANESTHESIA:  Epidural and local analgesia using 10 ml of 0.5% Marcaine  COMPLICATIONS:  None immediate.  ESTIMATED BLOOD LOSS: 5 ml.  FLUIDS: 400 ml LR.  URINE OUTPUT:  50 ml of clear urine.  INDICATIONS:  27 y.o. [id number] with undesired fertility,status post vaginal delivery, desires permanent sterilization.  Other reversible forms of contraception were discussed with patient; she declines all other modalities. Risks of procedure discussed with patient including but not limited to: risk of regret, permanence of method, bleeding, infection, injury to surrounding organs and need for additional procedures.  Failure risk of 1 -2 % with increased risk of ectopic gestation if pregnancy occurs was also discussed with patient.      FINDINGS:  Normal uterus, tubes, and ovaries.  PROCEDURE DETAILS: The patient was taken to the operating room where her epidural anesthesia was dosed up to surgical level and found to be adequate.  She was then placed in the dorsal supine position and prepped and draped in sterile fashion.  After an adequate timeout was performed, attention was turned to the patient's abdomen where a small transverse skin incision was made under the umbilical fold. The incision was taken down to the layer of fascia using the scalpel, and fascia was incised, and extended bilaterally using Mayo scissors. The peritoneum was entered in a sharp fashion. Attention was then turned to the patient's uterus, and left fallopian tube was identified and followed out to the fimbriated end.  A Filshie clip was placed on the left fallopian tube about 3 cm from the cornual attachment, with care given to incorporate the underlying mesosalpinx.  A  similar process was carried out on the right side allowing for bilateral tubal sterilization.  Good hemostasis was noted overall.  Local analgesia was sprinked onto both Filshie application sites.The instruments were then removed from the patient's abdomen and the fascial incision was repaired with 0 Vicryl, and the skin was closed with a 4-0 Vicryl subcuticular stitch. The patient tolerated the procedure well.  Instrument, sponge, and needle counts were correct times two.  The patient was then taken to the recovery room awake and in stable condition.   Caryl AdaJazma Anette Barra, DO OB Fellow [facility]

## 2017-06-16 NOTE — Anesthesia Pain Management Evaluation Note (Signed)
  CRNA Pain Management Visit Note  Patient: Lauren Sweeney, 27 y.o., female  "Hello I am a [facility] the anesthesia [facility]. We have an anesthesia team available at all times to provide care throughout the hospital, including epidural management and anesthesia for C-section. I don't know your plan for the delivery whether it a natural birth, water birth, IV sedation, nitrous supplementation, doula or epidural, but we want to meet your pain goals."   1.Was your pain managed to your expectations on prior hospitalizations?   Yes   2.What is your expectation for pain management during this hospitalization?     Epidural  3.How can we help you reach that goal? epidural  Record the patient's initial score and the patient's pain goal.   Pain: 0  Pain Goal: 4 The [facility] wants you to be able to say your pain was always managed very well.  [name] 06/16/2017
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
## 2017-06-16 NOTE — Transfer of Care (Signed)
Immediate Anesthesia Transfer of Care Note  Patient: Lauren Sweeney  Procedure(s) Performed: POST PARTUM TUBAL LIGATION (N/A )  Patient Location: PACU  Anesthesia Type:Epidural  Level of Consciousness: awake  Airway & Oxygen Therapy: Patient Spontanous Breathing  Post-op Assessment: Report given to RN  Post vital signs: Reviewed and stable  Last Vitals:  Vitals:   06/16/17 1431 06/16/17 1438  BP: 125/82 123/70  Pulse: 65 65  Resp:    Temp:  36.9 C  SpO2:      Last Pain:  Vitals:   06/16/17 1438  TempSrc: Oral         Complications: No apparent anesthesia complications

## 2017-06-16 NOTE — Anesthesia Postprocedure Evaluation (Signed)
Anesthesia Post Note  Patient: Lauren Sweeney  Procedure(s) Performed: POST PARTUM TUBAL LIGATION (N/A )     Patient location during evaluation: PACU Anesthesia Type: Epidural Level of consciousness: awake and alert Pain management: pain level controlled Vital Signs Assessment: post-procedure vital signs reviewed and stable Respiratory status: spontaneous breathing and respiratory function stable Cardiovascular status: blood pressure returned to baseline and stable Postop Assessment: no headache, no backache, no apparent nausea or vomiting and epidural receding Anesthetic complications: no    Last Vitals:  Vitals:   06/16/17 1620 06/16/17 1631  BP:  (!) 109/59  Pulse: 70 (!) 59  Resp: (!) 21 18  Temp:    SpO2: 91%     Last Pain:  Vitals:   06/16/17 1630  TempSrc:   PainSc: 1    Pain Goal: Patients Stated Pain Goal: 3 (06/16/17 1630)               Phillips Groutarignan, Strider Vallance

## 2017-06-16 NOTE — Anesthesia Procedure Notes (Signed)
Epidural Patient location during procedure: OB  Staffing Anesthesiologist: Dhani Dannemiller, MD Performed: anesthesiologist   Preanesthetic Checklist Completed: patient identified, site marked, surgical consent, pre-op evaluation, timeout performed, IV checked, risks and benefits discussed and monitors and equipment checked  Epidural Patient position: sitting Prep: DuraPrep Patient monitoring: heart rate, continuous pulse ox and blood pressure Approach: right paramedian Location: L3-L4 Injection technique: LOR saline  Needle:  Needle type: Tuohy  Needle gauge: 17 G Needle length: 9 cm and 9 Needle insertion depth: 6 cm Catheter type: closed end flexible Catheter size: 20 Guage Catheter at skin depth: 10 cm Test dose: negative  Assessment Events: blood not aspirated, injection not painful, no injection resistance, negative IV test and no paresthesia  Additional Notes Patient identified. Risks/Benefits/Options discussed with patient including but not limited to bleeding, infection, nerve damage, paralysis, failed block, incomplete pain control, headache, blood pressure changes, nausea, vomiting, reactions to medication both or allergic, itching and postpartum back pain. Confirmed with bedside nurse the patient's most recent platelet count. Confirmed with patient that they are not currently taking any anticoagulation, have any bleeding history or any family history of bleeding disorders. Patient expressed understanding and wished to proceed. All questions were answered. Sterile technique was used throughout the entire procedure. Please see nursing notes for vital signs. Test dose was given through epidural needle and negative prior to continuing to dose epidural or start infusion. Warning signs of high block given to the patient including shortness of breath, tingling/numbness in hands, complete motor block, or any concerning symptoms with instructions to call for help. Patient was given  instructions on fall risk and not to get out of bed. All questions and concerns addressed with instructions to call with any issues.     

## 2017-06-16 NOTE — Anesthesia Preprocedure Evaluation (Signed)

## 2017-06-16 NOTE — Anesthesia Preprocedure Evaluation (Signed)
Anesthesia Evaluation  Patient identified by MRN, date of birth, ID band Patient awake    Reviewed: Allergy & Precautions, NPO status , Patient's Chart, lab work & pertinent test results  Airway Mallampati: II  TM Distance: >3 FB Neck ROM: Full    Dental no notable dental hx.    Pulmonary neg pulmonary ROS,    Pulmonary exam normal breath sounds clear to auscultation       Cardiovascular negative cardio ROS Normal cardiovascular exam Rhythm:Regular Rate:Normal     Neuro/Psych negative neurological ROS  negative psych ROS   GI/Hepatic negative GI ROS, Neg liver ROS,   Endo/Other  negative endocrine ROS  Renal/GU negative Renal ROS  negative genitourinary   Musculoskeletal negative musculoskeletal ROS (+)   Abdominal   Peds negative pediatric ROS (+)  Hematology negative hematology ROS (+)   Anesthesia Other Findings   Reproductive/Obstetrics negative OB ROS                             Anesthesia Physical Anesthesia Plan  ASA: II  Anesthesia Plan: Epidural   Post-op Pain Management:    Induction:   PONV Risk Score and Plan: 2 and Ondansetron and Treatment may vary due to age or medical condition  Airway Management Planned: Natural Airway  Additional Equipment:   Intra-op Plan:   Post-operative Plan:   Informed Consent: I have reviewed the patients History and Physical, chart, labs and discussed the procedure including the risks, benefits and alternatives for the proposed anesthesia with the patient or authorized representative who has indicated his/her understanding and acceptance.   Dental advisory given  Plan Discussed with:   Anesthesia Plan Comments: (Existing labor epidural will be used)        Anesthesia Quick Evaluation

## 2017-06-16 NOTE — Anesthesia Postprocedure Evaluation (Signed)
Anesthesia Post Note  Patient: Lauren Sweeney  Procedure(s) Performed: AN AD HOC LABOR EPIDURAL     Patient location during evaluation: Mother Baby Anesthesia Type: Epidural Level of consciousness: awake and alert and oriented Pain management: satisfactory to patient Vital Signs Assessment: post-procedure vital signs reviewed and stable Respiratory status: spontaneous breathing and nonlabored ventilation Cardiovascular status: stable Postop Assessment: no headache, no backache, no signs of nausea or vomiting, adequate PO intake and patient able to bend at knees (patient up walking) Anesthetic complications: no    Last Vitals:  Vitals:   06/16/17 1620 06/16/17 1631  BP:  (!) 109/59  Pulse: 70 (!) 59  Resp: (!) 21 18  Temp:    SpO2: 91%     Last Pain:  Vitals:   06/16/17 1630  TempSrc:   PainSc: 1    Pain Goal: Patients Stated Pain Goal: 3 (06/16/17 1630)               Madison HickmanGREGORY,Merion Grimaldo

## 2017-06-16 NOTE — Anesthesia Postprocedure Evaluation (Signed)
Anesthesia Post Note  Patient: Lauren Sweeney  Procedure(s) Performed: AN AD HOC LABOR EPIDURAL     Patient location during evaluation: Mother Baby Anesthesia Type: Epidural Level of consciousness: awake and alert and oriented Pain management: satisfactory to patient Vital Signs Assessment: post-procedure vital signs reviewed and stable Respiratory status: spontaneous breathing and nonlabored ventilation Cardiovascular status: stable Postop Assessment: no headache, no backache, no signs of nausea or vomiting, adequate PO intake and patient able to bend at knees (patient up walking) Anesthetic complications: no    Last Vitals:  Vitals:   06/16/17 1620 06/16/17 1631  BP:  (!) 109/59  Pulse: 70 (!) 59  Resp: (!) 21 18  Temp:    SpO2: 91%     Last Pain:  Vitals:   06/16/17 1630  TempSrc:   PainSc: 1    Pain Goal: Patients Stated Pain Goal: 3 (06/16/17 1630)               Osric Klopf     

## 2017-06-17 ENCOUNTER — Encounter (HOSPITAL_COMMUNITY): Payer: Self-pay

## 2017-06-17 LAB — CBC
HEMATOCRIT: 25.7 % — AB (ref 36.0–46.0)
HEMOGLOBIN: 8.1 g/dL — AB (ref 12.0–15.0)
MCH: 23.7 pg — AB (ref 26.0–34.0)
MCHC: 31.5 g/dL (ref 30.0–36.0)
MCV: 75.1 fL — AB (ref 78.0–100.0)
Platelets: 287 10*3/uL (ref 150–400)
RBC: 3.42 MIL/uL — AB (ref 3.87–5.11)
RDW: 16.2 % — ABNORMAL HIGH (ref 11.5–15.5)
WBC: 11.8 10*3/uL — ABNORMAL HIGH (ref 4.0–10.5)

## 2017-06-17 MED ORDER — SENNOSIDES-DOCUSATE SODIUM 8.6-50 MG PO TABS: 2.0000 | ORAL_TABLET | ORAL | 0 refills | Status: DC

## 2017-06-17 MED ORDER — IBUPROFEN 600 MG PO TABS: 600.0000 mg | ORAL_TABLET | Freq: Four times a day (QID) | ORAL | 0 refills | Status: DC

## 2017-06-17 MED ORDER — OXYCODONE HCL 5 MG PO TABS: 5.0000 mg | ORAL_TABLET | Freq: Four times a day (QID) | ORAL | 0 refills | Status: DC | PRN

## 2017-06-17 NOTE — Discharge Instructions (Signed)

## 2017-06-17 NOTE — Lactation Note (Signed)
This note was copied from a baby's chart. Lactation Consultation Note  Patient Name: Lauren Sweeney [id number]'XToday's Date: 06/17/2017 Reason for consult: Initial assessment;Term Breastfeeding consultation services and support information given to patient.  Baby is now 3722 hours old.  Mom reports baby is latching without difficulty and feeding well.  Instructed to continue feeding with cues.  No questions or concerns at present time.  Encouraged to call with concerns prn.  Maternal Data Does the patient have breastfeeding experience prior to this delivery?: Yes  Feeding Feeding Type: Breast Fed Length of feed: 15 min  LATCH Score                   Interventions    Lactation Tools Discussed/Used     Consult Status Consult Status: PRN    Huston FoleyMOULDEN, Nazanin Kinner S 06/17/2017, 9:04 AM

## 2017-06-17 NOTE — Progress Notes (Signed)
CSW received consult for hx of Anxiety and Depression.  CSW met with MOB to offer support and complete assessment.    When CSW arrived MOB was resting in bed and reported that infant was with bedside nurse getting a bath.  MOB appeared flat but was easy to engage.  MOB was also receptive to meeting with CSW.   CSW asked about MOB's MH hx and MOB denied having a hx.  MOB communicated, "I have never had anxiety or depression."  CSW reviewed some common symptoms and denied experiencing any of them.  MOB also denied having PPD with MOB's older four children, however, CSW utilized the time to provided education regarding the baby blues period vs. perinatal mood disorders.  CSW discussed treatment and gave resources for mental health follow up if concerns arise.    CSW provided review of Sudden Infant Death Syndrome (SIDS) precautions.    CSW also provided MOB with information to add infant to MOB's Food Stamp and Medicaid application.   MOB reported moving to [place] from [place] almost 2 yrs ago.  MOB shared that MOB feels prepared to parenting and reporting having all necessary items for infant; CSW observed a new car seat in MOB's room.  CSW identifies no further need for intervention and no barriers to discharge at this time.  Lauren Sweeney, MSW, LCSW Clinical Social Work [phone] 

## 2017-06-17 NOTE — Discharge Summary (Signed)
OB Discharge Summary     Patient Name: Lauren Sweeney DOB: 1991/04/22 MRN: [id number]  Date of admission: 06/16/2017 Delivering MD: Adam PhenixARNOLD, JAMES G   Date of discharge: 06/17/2017  Admitting diagnosis: INDUCTION Intrauterine pregnancy: [id number]     Secondary diagnosis:  Active Problems:   Severe hyperemesis gravidarum   SVD (spontaneous vaginal delivery)  Additional problems: hyperemesis gravidarum     Discharge diagnosis: Term Pregnancy Delivered                                                                                                Post partum procedures:postpartum tubal ligation  Augmentation: Pitocin  Complications: None  Hospital course:  Induction of Labor With Vaginal Delivery   27 y.o. yo G5P5005 at 6842w0d was admitted to the hospital 06/16/2017 for induction of labor.  Indication for induction: hyperemesis gravidarum.  Patient had an uncomplicated labor course as follows: Membrane Rupture Time/Date: 10:25 AM ,06/16/2017   Intrapartum Procedures: Episiotomy: None [1]                                         Lacerations:  None [1]  Patient had delivery of a Viable infant.  Information for the patient's newborn:  Claud KelpWhite, Boy Thursa [[id number]][[id number]]  Delivery Method: Vaginal, Spontaneous(Filed from Delivery Summary)   06/16/2017  Details of delivery can be found in separate delivery note.  Patient had a routine postpartum course. Patient is discharged home 06/17/17.  Physical exam  Vitals:   06/16/17 2116 06/16/17 2209 06/17/17 0135 06/17/17 0508  BP: (!) 152/76 139/77 117/66 109/65  Pulse: (!) 56 (!) 58 62 (!) 59  Resp: 20  18 20   Temp: 98.5 F (36.9 C)  98.7 F (37.1 C) 98.3 F (36.8 C)  TempSrc: Oral  Oral Oral  SpO2: 98%  97% 96%  Weight:    62.6 kg (138 lb)  Height:       General: alert, cooperative and no distress Lochia: appropriate Uterine Fundus: firm Incision: Healing well with no significant drainage, Dressing is clean, dry, and intact DVT  Evaluation: No evidence of DVT seen on physical exam. No cords or calf tenderness. No significant calf/ankle edema. Labs: Lab Results  Component Value Date   WBC 11.8 (H) 06/17/2017   HGB 8.1 (L) 06/17/2017   HCT 25.7 (L) 06/17/2017   MCV 75.1 (L) 06/17/2017   PLT 287 06/17/2017   CMP Latest Ref Rng & Units 02/08/2017  Glucose 65 - 99 mg/dL 782(N269(H)  BUN 6 - 20 mg/dL 6  Creatinine 5.620.44 - 1.301.00 mg/dL 8.650.58  Sodium 784135 - 696145 mmol/L 134(L)  Potassium 3.5 - 5.1 mmol/L 3.3(L)  Chloride 101 - 111 mmol/L 104  CO2 22 - 32 mmol/L 22  Calcium 8.9 - 10.3 mg/dL 2.9(B8.5(L)  Total Protein 6.5 - 8.1 g/dL 6.2(L)  Total Bilirubin 0.3 - 1.2 mg/dL 0.3  Alkaline Phos 38 - 126 U/L 52  AST 15 - 41 U/L 22  ALT 14 - 54 U/L 28  Discharge instruction: per After Visit Summary and "Baby and Me Booklet".  After visit meds:  Allergies as of 06/17/2017   No Known Allergies     Medication List    STOP taking these medications   ondansetron 8 MG disintegrating tablet Commonly known as:  ZOFRAN-ODT     TAKE these medications   ibuprofen 600 MG tablet Commonly known as:  ADVIL,MOTRIN Take 1 tablet (600 mg total) by mouth every 6 (six) hours.   oxyCODONE 5 MG immediate release tablet Commonly known as:  Oxy IR/ROXICODONE Take 1 tablet (5 mg total) by mouth every 6 (six) hours as needed for severe pain.   pantoprazole 40 MG tablet Commonly known as:  PROTONIX TK 1 T PO BID   ranitidine 150 MG tablet Commonly known as:  ZANTAC Take 1 tablet (150 mg total) by mouth 2 (two) times daily.   senna-docusate 8.6-50 MG tablet Commonly known as:  Senokot-S Take 2 tablets by mouth daily. Start taking on:  06/18/2017   VITAMIN B 12 PO Take 1 tablet by mouth daily.       Diet: routine diet  Activity: Advance as tolerated. Pelvic rest for 6 weeks.   Outpatient follow up:2 weeks Follow up Appt:No future appointments. Follow up Visit:No Follow-up on file. Follow-up Information    [facility]. Schedule an appointment as soon as possible for a visit in 2 week(s).   Why:  Please follow up in 2 weeks for incision check Contact information: [address] [place] [address] [phone]          Postpartum contraception: Tubal Ligation  Newborn Data: Live born female  Birth Weight: 6 lb 14.1 oz (3120 g) APGAR: 9, 9  Newborn Delivery   Birth date/time:  06/16/2017 11:04:00 Delivery type:  Vaginal, Spontaneous     Baby Feeding: Bottle and Breast Disposition:home with mother   06/17/2017 Oralia Manis, DO  PGY-1  Midwife attestation I have seen and examined this patient and agree with above documentation in the resident's note.   Adilene Areola is a 27 y.o. [id number] s/p SVD and BTL.   Pain is well controlled.  Plan for birth control is bilateral tubal ligation.  Method of Feeding: breast/formula  PE:  Gen: well appearing Heart: reg rate Lungs: normal WOB Fundus firm Ext: soft, no pain, no edema  Recent Labs    06/16/17 0758 06/17/17 0440  HGB 8.7* 8.1*  HCT 28.1* 25.7*    Assessment - s/p SVD and BTL - PPD #1  Plan: - discharge today - postpartum care discussed - f/u clinic in 6 weeks for postpartum visit   Donette Larry, CNM 7:54 AM

## 2017-06-23 ENCOUNTER — Encounter: Payer: Medicaid Other | Admitting: Obstetrics and Gynecology

## 2017-12-04 ENCOUNTER — Other Ambulatory Visit: Payer: Self-pay | Admitting: Orthopaedic Surgery

## 2017-12-04 DIAGNOSIS — M545 Low back pain: Secondary | ICD-10-CM

## 2017-12-04 DIAGNOSIS — M5126 Other intervertebral disc displacement, lumbar region: Secondary | ICD-10-CM

## 2017-12-05 ENCOUNTER — Inpatient Hospital Stay
Admission: RE | Admit: 2017-12-05 | Discharge: 2017-12-05 | Disposition: A | Discharge status: Home / Self Care | Payer: Medicaid Other | Source: Ambulatory Visit | Attending: Orthopaedic Surgery | Admitting: Orthopaedic Surgery

## 2017-12-17 ENCOUNTER — Ambulatory Visit
Admission: RE | Admit: 2017-12-17 | Discharge: 2017-12-17 | Disposition: A | Discharge status: Home / Self Care | Payer: Self-pay | Source: Ambulatory Visit | Attending: Orthopaedic Surgery | Admitting: Orthopaedic Surgery

## 2017-12-17 DIAGNOSIS — M545 Low back pain: Secondary | ICD-10-CM

## 2017-12-17 DIAGNOSIS — M5126 Other intervertebral disc displacement, lumbar region: Secondary | ICD-10-CM

## 2018-09-22 HISTORY — PX: ANTERIOR CERVICAL DECOMP/DISCECTOMY FUSION: SHX1161

## 2018-12-22 ENCOUNTER — Other Ambulatory Visit: Payer: Self-pay | Admitting: Neurosurgery

## 2018-12-22 DIAGNOSIS — M5416 Radiculopathy, lumbar region: Secondary | ICD-10-CM

## 2018-12-28 ENCOUNTER — Other Ambulatory Visit: Payer: Self-pay

## 2019-01-13 HISTORY — PX: DECOMPRESSIVE LUMBAR LAMINECTOMY LEVEL 2: SHX5792
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
## 2023-05-29 DIAGNOSIS — Z113 Encounter for screening for infections with a predominantly sexual mode of transmission: Secondary | ICD-10-CM | POA: Diagnosis not present

## 2023-05-29 DIAGNOSIS — N946 Dysmenorrhea, unspecified: Secondary | ICD-10-CM | POA: Diagnosis not present

## 2023-05-29 DIAGNOSIS — N920 Excessive and frequent menstruation with regular cycle: Secondary | ICD-10-CM | POA: Diagnosis not present

## 2023-05-29 DIAGNOSIS — Z01419 Encounter for gynecological examination (general) (routine) without abnormal findings: Secondary | ICD-10-CM | POA: Diagnosis not present

## 2023-05-30 DIAGNOSIS — Z1329 Encounter for screening for other suspected endocrine disorder: Secondary | ICD-10-CM | POA: Diagnosis not present

## 2023-05-30 DIAGNOSIS — Z13220 Encounter for screening for lipoid disorders: Secondary | ICD-10-CM | POA: Diagnosis not present

## 2023-05-30 DIAGNOSIS — Z131 Encounter for screening for diabetes mellitus: Secondary | ICD-10-CM | POA: Diagnosis not present

## 2023-05-30 DIAGNOSIS — Z0000 Encounter for general adult medical examination without abnormal findings: Secondary | ICD-10-CM | POA: Diagnosis not present

## 2023-05-30 DIAGNOSIS — Z1159 Encounter for screening for other viral diseases: Secondary | ICD-10-CM | POA: Diagnosis not present

## 2023-05-30 DIAGNOSIS — Z130 Encounter for screening for diseases of the blood and blood-forming organs and certain disorders involving the immune mechanism: Secondary | ICD-10-CM | POA: Diagnosis not present

## 2023-05-30 DIAGNOSIS — Z114 Encounter for screening for human immunodeficiency virus [HIV]: Secondary | ICD-10-CM | POA: Diagnosis not present

## 2023-05-30 DIAGNOSIS — Z113 Encounter for screening for infections with a predominantly sexual mode of transmission: Secondary | ICD-10-CM | POA: Diagnosis not present

## 2023-06-08 ENCOUNTER — Emergency Department (HOSPITAL_COMMUNITY)
Admission: EM | Admit: 2023-06-08 | Discharge: 2023-06-08 | Disposition: A | Discharge status: Home / Self Care | Payer: Self-pay | Attending: Emergency Medicine | Admitting: Emergency Medicine

## 2023-06-08 ENCOUNTER — Telehealth (HOSPITAL_COMMUNITY): Payer: Self-pay | Admitting: Emergency Medicine

## 2023-06-08 ENCOUNTER — Emergency Department (HOSPITAL_COMMUNITY): Payer: Self-pay

## 2023-06-08 ENCOUNTER — Encounter (HOSPITAL_COMMUNITY): Payer: Self-pay | Admitting: *Deleted

## 2023-06-08 DIAGNOSIS — R1030 Lower abdominal pain, unspecified: Secondary | ICD-10-CM

## 2023-06-08 DIAGNOSIS — R1032 Left lower quadrant pain: Secondary | ICD-10-CM | POA: Diagnosis not present

## 2023-06-08 DIAGNOSIS — R1031 Right lower quadrant pain: Secondary | ICD-10-CM | POA: Insufficient documentation

## 2023-06-08 MED ORDER — HYDROCODONE-ACETAMINOPHEN 5-325 MG PO TABS: 1.0000 | ORAL_TABLET | Freq: Four times a day (QID) | ORAL | 0 refills | Status: DC | PRN

## 2023-06-08 MED ORDER — HYDROMORPHONE HCL 1 MG/ML IJ SOLN
1.0000 mg | Freq: Once | INTRAMUSCULAR | Status: AC
  Administered 2023-06-08: 1 mg via INTRAVENOUS
  Filled 2023-06-08: qty 1

## 2023-06-08 MED ORDER — ONDANSETRON HCL 4 MG/2ML IJ SOLN
4.0000 mg | Freq: Once | INTRAMUSCULAR | Status: AC
  Administered 2023-06-08: 4 mg via INTRAVENOUS
  Filled 2023-06-08: qty 2

## 2023-06-08 MED ORDER — NAPROXEN 500 MG PO TABS: 500.0000 mg | ORAL_TABLET | Freq: Two times a day (BID) | ORAL | 0 refills | Status: DC

## 2023-06-08 MED ORDER — KETOROLAC TROMETHAMINE 30 MG/ML IJ SOLN
30.0000 mg | Freq: Once | INTRAMUSCULAR | Status: AC
  Administered 2023-06-08: 30 mg via INTRAVENOUS
  Filled 2023-06-08: qty 1

## 2023-06-08 MED ORDER — IOHEXOL 300 MG/ML  SOLN
100.0000 mL | Freq: Once | INTRAMUSCULAR | Status: AC | PRN
  Administered 2023-06-08: 100 mL via INTRAVENOUS

## 2023-06-08 MED ORDER — NAPROXEN 375 MG PO TABS: 375.0000 mg | ORAL_TABLET | Freq: Two times a day (BID) | ORAL | 0 refills | Status: DC

## 2023-06-08 NOTE — Telephone Encounter (Signed)
Pt registered under incorrect information during ED visit.  Rx sent under correct information.

## 2023-06-09 ENCOUNTER — Encounter (HOSPITAL_COMMUNITY): Payer: Self-pay

## 2023-06-10 DIAGNOSIS — N946 Dysmenorrhea, unspecified: Secondary | ICD-10-CM | POA: Diagnosis not present

## 2023-06-10 DIAGNOSIS — N920 Excessive and frequent menstruation with regular cycle: Secondary | ICD-10-CM | POA: Diagnosis not present

## 2023-07-09 ENCOUNTER — Other Ambulatory Visit: Payer: Self-pay | Admitting: Obstetrics and Gynecology

## 2023-08-26 ENCOUNTER — Encounter (HOSPITAL_COMMUNITY)
Admission: RE | Admit: 2023-08-26 | Discharge: 2023-08-26 | Disposition: A | Discharge status: Home / Self Care | Source: Ambulatory Visit | Attending: Obstetrics and Gynecology | Admitting: Obstetrics and Gynecology

## 2023-08-26 ENCOUNTER — Encounter (HOSPITAL_COMMUNITY): Payer: Self-pay | Admitting: Obstetrics and Gynecology

## 2023-08-26 DIAGNOSIS — Z01812 Encounter for preprocedural laboratory examination: Secondary | ICD-10-CM | POA: Diagnosis not present

## 2023-08-26 DIAGNOSIS — N946 Dysmenorrhea, unspecified: Secondary | ICD-10-CM | POA: Diagnosis not present

## 2023-08-26 DIAGNOSIS — D259 Leiomyoma of uterus, unspecified: Secondary | ICD-10-CM | POA: Diagnosis not present

## 2023-08-26 DIAGNOSIS — N920 Excessive and frequent menstruation with regular cycle: Secondary | ICD-10-CM | POA: Diagnosis not present

## 2023-08-26 DIAGNOSIS — Z01818 Encounter for other preprocedural examination: Secondary | ICD-10-CM

## 2023-08-26 LAB — BASIC METABOLIC PANEL WITH GFR
Anion gap: 9 (ref 5–15)
BUN: 10 mg/dL (ref 6–20)
CO2: 25 mmol/L (ref 22–32)
Calcium: 9.1 mg/dL (ref 8.9–10.3)
Chloride: 103 mmol/L (ref 98–111)
Creatinine, Ser: 0.72 mg/dL (ref 0.44–1.00)
GFR, Estimated: >60 mL/min (ref >60)
Glucose, Bld: 92 mg/dL (ref 70–99)
Potassium: 3.4 mmol/L — ABNORMAL LOW (ref 3.5–5.1)
Sodium: 137 mmol/L (ref 135–145)

## 2023-08-26 LAB — CBC
HCT: 39.8 % (ref 36.0–46.0)
Hemoglobin: 12.3 g/dL (ref 12.0–15.0)
MCH: 26.1 pg (ref 26.0–34.0)
MCHC: 30.9 g/dL (ref 30.0–36.0)
MCV: 84.3 fL (ref 80.0–100.0)
Platelets: 352 10*3/uL (ref 150–400)
RBC: 4.72 MIL/uL (ref 3.87–5.11)
RDW: 13.7 % (ref 11.5–15.5)
WBC: 8.9 10*3/uL (ref 4.0–10.5)
nRBC: 0 % (ref 0.0–0.2)

## 2023-08-26 LAB — TYPE AND SCREEN
ABO/RH(D): O POS
Antibody Screen: NEGATIVE

## 2023-08-26 NOTE — Pre-Procedure Instructions (Signed)
 Surgical Instructions   Your procedure is scheduled on :  Friday,  08-29-2023. Report to [facility] Main Entrance "A" at 5:30  A.M., then check in with the Admitting office. Any questions or running late day of surgery: call [phone]  Questions prior to your surgery date: call [phone], Monday-Friday, 8am-4pm. If you experience any cold or flu symptoms such as cough, fever, chills, shortness of breath, etc. between now and your scheduled surgery, please notify your surgeon office.    Remember:  Do not eat any food and do not drink any liquid after midnight the night before your surgery.  This includes no water,  candy,  gum,  and  mints.     Take these medicines the morning of surgery with A SIP OF WATER :  none   May take these medicines IF NEEDED:  none    One week prior to surgery, STOP taking any Aspirin (unless otherwise instructed by your surgeon) Aleve , Naproxen , Ibuprofen , Motrin , Advil , Goody's, BC's, all herbal medications, fish oil, and non-prescription vitamins.                     Do NOT Smoke (Tobacco/Vaping) for 24 hours prior to your procedure.    You will be asked to remove any contacts, glasses, piercing's, hearing aid's, dentures/partials prior to surgery. Please bring cases for these items if needed.    Patients discharged the day of surgery will not be allowed to drive home, and someone needs to stay with them for 24 hours.  SURGICAL WAITING ROOM VISITATION Patients may have no more than 2 support people in the waiting area - these visitors may rotate.   Pre-op nurse will coordinate an appropriate time for 1 ADULT support person, who may not rotate, to accompany patient in pre-op.  Children under the age of 43 must have an adult with them who is not the patient and must remain in the main waiting area with an adult.  If the patient needs to stay at the hospital during part of their recovery, the visitor guidelines for inpatient rooms apply.  Please  refer to the [facility] website for the visitor guidelines for any additional information.   If you received a COVID test during your pre-op visit  it is requested that you wear a mask when out in public, stay away from anyone that may not be feeling well and notify your surgeon if you develop symptoms. If you have been in contact with anyone that has tested positive in the last 10 days please notify you surgeon.      Pre-operative CHG Bathing Instructions   You can play a key role in reducing the risk of infection after surgery. Your skin needs to be as free of germs as possible. You can reduce the number of germs on your skin by washing with CHG (chlorhexidine  gluconate) soap before surgery. CHG is an antiseptic soap that kills germs and continues to kill germs even after washing.   DO NOT use if you have an allergy to chlorhexidine /CHG or antibacterial soaps. If your skin becomes reddened or irritated, stop using the CHG and notify Pre-Op nurse day of surgery.  Please get dial soap or other antibacterial soap and shower following the instructions below.             TAKE A SHOWER THE NIGHT BEFORE SURGERY AND THE DAY OF SURGERY    Please keep in mind the following:  DO NOT shave, including legs and  underarms, 48 hours prior to surgery.   You may shave your face before/day of surgery.  Place clean sheets on your bed the night before surgery Use a clean washcloth (not used since being washed) for each shower. DO NOT sleep with pet's night before surgery.  CHG Shower Instructions:  Wash your face and private area with normal soap. If you choose to wash your hair, wash first with your normal shampoo.  After you use shampoo/soap, rinse your hair and body thoroughly to remove shampoo/soap residue.  Turn the water OFF and apply half the bottle of CHG soap to a CLEAN washcloth.  Apply CHG soap ONLY FROM YOUR NECK DOWN TO YOUR TOES (washing for 3-5 minutes)  DO NOT use CHG soap on face,  private areas, open wounds, or sores.  Pay special attention to the area where your surgery is being performed.  If you are having back surgery, having someone wash your back for you may be helpful. Wait 2 minutes after CHG soap is applied, then you may rinse off the CHG soap.  Pat dry with a clean towel  Put on clean pajamas    Additional instructions for the day of surgery: DO NOT APPLY any lotions, oils, deodorants (may use underarm deodorant) ,  cologne / perfumes  or  makeup Do not wear jewelry /  piercing's/  metal/  permanent jewelry must be removed prior to arrival day of surgery  (includes plastic) Do not wear nail polish, gel polish, artificial nails, or any other type of covering on natural finger nails (toe nails are okay) Do not bring valuables to the hospital. [facility] is not responsible for valuables/personal belongings. Put on clean/comfortable clothes.  Please brush your teeth.  Ask your nurse before applying any prescription medications to the skin.

## 2023-08-26 NOTE — Progress Notes (Signed)
 Spoke w/ via phone for pre-op interview--- pt Lab needs dos----  urine preg       Lab results------ lab appt 08-26-2023 @ 1130  getting CBC/ BMP/ T&S COVID test -----patient states asymptomatic no test needed Arrive at ------- 0530 on 08-29-2023 NPO after MN  Pre-Surgery Ensure or G2: n/a  Med rec completed Medications to take morning of surgery ----- none Diabetic medication ----- n/a  GLP1 agonist last dose: n/a GLP1 instructions:  Patient instructed no nail polish to be worn day of surgery Patient instructed to bring photo id and insurance card day of surgery Patient aware to have Driver (ride ) / caregiver    for 24 hours after surgery - daughter, azanera richardson  (age 34) Patient Special Instructions ----- will pick up bag w/ soap and written instructions at lab appt Pre-Op special Instructions ----- n/a  Patient verbalized understanding of instructions that were given at this phone interview. Patient denies chest pain, sob, fever, cough at the interview.

## 2023-08-29 ENCOUNTER — Ambulatory Visit (HOSPITAL_COMMUNITY)
Admission: RE | Admit: 2023-08-29 | Discharge: 2023-08-30 | Disposition: A | Discharge status: Home / Self Care | Payer: Self-pay | Attending: Obstetrics and Gynecology | Admitting: Obstetrics and Gynecology

## 2023-08-29 ENCOUNTER — Encounter (HOSPITAL_COMMUNITY): Payer: Self-pay | Admitting: Obstetrics and Gynecology

## 2023-08-29 ENCOUNTER — Other Ambulatory Visit: Payer: Self-pay

## 2023-08-29 ENCOUNTER — Encounter (HOSPITAL_COMMUNITY)
Admission: RE | Disposition: A | Discharge status: Home / Self Care | Payer: Self-pay | Source: Home / Self Care | Attending: Obstetrics and Gynecology

## 2023-08-29 ENCOUNTER — Ambulatory Visit (HOSPITAL_COMMUNITY): Payer: Self-pay | Admitting: Anesthesiology

## 2023-08-29 ENCOUNTER — Ambulatory Visit (HOSPITAL_BASED_OUTPATIENT_CLINIC_OR_DEPARTMENT_OTHER): Payer: Self-pay | Admitting: Anesthesiology

## 2023-08-29 DIAGNOSIS — N946 Dysmenorrhea, unspecified: Secondary | ICD-10-CM | POA: Insufficient documentation

## 2023-08-29 DIAGNOSIS — Z01818 Encounter for other preprocedural examination: Secondary | ICD-10-CM

## 2023-08-29 DIAGNOSIS — N920 Excessive and frequent menstruation with regular cycle: Secondary | ICD-10-CM | POA: Insufficient documentation

## 2023-08-29 DIAGNOSIS — N8003 Adenomyosis of the uterus: Secondary | ICD-10-CM | POA: Insufficient documentation

## 2023-08-29 DIAGNOSIS — D259 Leiomyoma of uterus, unspecified: Secondary | ICD-10-CM

## 2023-08-29 DIAGNOSIS — N888 Other specified noninflammatory disorders of cervix uteri: Secondary | ICD-10-CM | POA: Insufficient documentation

## 2023-08-29 DIAGNOSIS — Z90710 Acquired absence of both cervix and uterus: Secondary | ICD-10-CM | POA: Diagnosis present

## 2023-08-29 HISTORY — DX: Other cervical disc degeneration, unspecified cervical region: M50.30

## 2023-08-29 HISTORY — DX: Excessive and frequent menstruation with regular cycle: N92.0

## 2023-08-29 HISTORY — DX: Other intervertebral disc degeneration, lumbosacral region without mention of lumbar back pain or lower extremity pain: M51.379

## 2023-08-29 HISTORY — PX: ROBOTIC ASSISTED TOTAL HYSTERECTOMY WITH SALPINGECTOMY: SHX6679

## 2023-08-29 HISTORY — DX: Dysmenorrhea, unspecified: N94.6

## 2023-08-29 LAB — POCT PREGNANCY, URINE: Preg Test, Ur: NEGATIVE

## 2023-08-29 SURGERY — ROBOTIC ASSISTED TOTAL HYSTERECTOMY WITH SALPINGECTOMY
Anesthesia: General | Site: Abdomen

## 2023-08-29 MED ORDER — ROCURONIUM BROMIDE 10 MG/ML (PF) SYRINGE
PREFILLED_SYRINGE | INTRAVENOUS | Status: AC
  Filled 2023-08-29: qty 10

## 2023-08-29 MED ORDER — HYDROMORPHONE HCL 1 MG/ML IJ SOLN
INTRAMUSCULAR | Status: AC
Start: 2023-08-29 — End: ?
  Filled 2023-08-29: qty 0.5

## 2023-08-29 MED ORDER — ONDANSETRON HCL 4 MG/2ML IJ SOLN
INTRAMUSCULAR | Status: DC | PRN
  Administered 2023-08-29: 4 mg via INTRAVENOUS

## 2023-08-29 MED ORDER — DEXMEDETOMIDINE HCL IN NACL 80 MCG/20ML IV SOLN
INTRAVENOUS | Status: AC
  Filled 2023-08-29: qty 20

## 2023-08-29 MED ORDER — ACETAMINOPHEN 500 MG PO TABS
1000.0000 mg | ORAL_TABLET | Freq: Once | ORAL | Status: AC
  Administered 2023-08-29: 1000 mg via ORAL

## 2023-08-29 MED ORDER — DEXAMETHASONE SODIUM PHOSPHATE 10 MG/ML IJ SOLN
INTRAMUSCULAR | Status: AC
  Filled 2023-08-29: qty 1

## 2023-08-29 MED ORDER — FENTANYL CITRATE (PF) 100 MCG/2ML IJ SOLN
INTRAMUSCULAR | Status: DC | PRN
  Administered 2023-08-29: 100 ug via INTRAVENOUS
  Administered 2023-08-29 (×3): 50 ug via INTRAVENOUS

## 2023-08-29 MED ORDER — OXYCODONE HCL 5 MG PO TABS
5.0000 mg | ORAL_TABLET | Freq: Once | ORAL | Status: DC | PRN
Start: 2023-08-29 — End: 2023-08-29

## 2023-08-29 MED ORDER — CHLORHEXIDINE GLUCONATE 0.12 % MT SOLN
15.0000 mL | Freq: Once | OROMUCOSAL | Status: AC
  Administered 2023-08-29: 15 mL via OROMUCOSAL

## 2023-08-29 MED ORDER — DEXAMETHASONE SODIUM PHOSPHATE 10 MG/ML IJ SOLN
INTRAMUSCULAR | Status: DC | PRN
  Administered 2023-08-29: 10 mg via INTRAVENOUS

## 2023-08-29 MED ORDER — PHENYLEPHRINE 80 MCG/ML (10ML) SYRINGE FOR IV PUSH (FOR BLOOD PRESSURE SUPPORT)
PREFILLED_SYRINGE | INTRAVENOUS | Status: AC
  Filled 2023-08-29: qty 10

## 2023-08-29 MED ORDER — POVIDONE-IODINE 10 % EX SWAB: 2.0000 | Freq: Once | CUTANEOUS | Status: DC

## 2023-08-29 MED ORDER — ACETAMINOPHEN 500 MG PO TABS
ORAL_TABLET | ORAL | Status: AC
  Filled 2023-08-29: qty 2

## 2023-08-29 MED ORDER — CEFAZOLIN SODIUM-DEXTROSE 2-4 GM/100ML-% IV SOLN
INTRAVENOUS | Status: AC
Start: 2023-08-29 — End: 2023-08-29
  Filled 2023-08-29: qty 100

## 2023-08-29 MED ORDER — CEFAZOLIN SODIUM-DEXTROSE 2-4 GM/100ML-% IV SOLN
2.0000 g | INTRAVENOUS | Status: AC
  Administered 2023-08-29: 2 g via INTRAVENOUS

## 2023-08-29 MED ORDER — PROPOFOL 10 MG/ML IV BOLUS
INTRAVENOUS | Status: AC
  Filled 2023-08-29: qty 20

## 2023-08-29 MED ORDER — SUGAMMADEX SODIUM 200 MG/2ML IV SOLN
INTRAVENOUS | Status: DC | PRN
Start: 2023-08-29 — End: 2023-08-29
  Administered 2023-08-29: 200 mg via INTRAVENOUS

## 2023-08-29 MED ORDER — SODIUM CHLORIDE (PF) 0.9 % IJ SOLN
INTRAMUSCULAR | Status: AC
  Filled 2023-08-29: qty 50

## 2023-08-29 MED ORDER — PANTOPRAZOLE SODIUM 40 MG PO TBEC
40.0000 mg | DELAYED_RELEASE_TABLET | Freq: Every day | ORAL | Status: DC
  Administered 2023-08-29: 40 mg via ORAL
  Filled 2023-08-29: qty 1

## 2023-08-29 MED ORDER — AMISULPRIDE (ANTIEMETIC) 5 MG/2ML IV SOLN: 10.0000 mg | Freq: Once | INTRAVENOUS | Status: DC | PRN

## 2023-08-29 MED ORDER — HYDROMORPHONE HCL 1 MG/ML IJ SOLN
INTRAMUSCULAR | Status: AC
  Filled 2023-08-29: qty 1

## 2023-08-29 MED ORDER — LACTATED RINGERS IV SOLN: INTRAVENOUS | Status: DC

## 2023-08-29 MED ORDER — CHLORHEXIDINE GLUCONATE 0.12 % MT SOLN
OROMUCOSAL | Status: AC
Start: 2023-08-29 — End: 2023-08-29
  Filled 2023-08-29: qty 15

## 2023-08-29 MED ORDER — ONDANSETRON HCL 4 MG/2ML IJ SOLN: 4.0000 mg | Freq: Four times a day (QID) | INTRAMUSCULAR | Status: DC | PRN

## 2023-08-29 MED ORDER — OXYCODONE HCL 5 MG PO TABS
5.0000 mg | ORAL_TABLET | ORAL | Status: DC | PRN
  Administered 2023-08-29 – 2023-08-30 (×4): 10 mg via ORAL
  Filled 2023-08-29 (×4): qty 2

## 2023-08-29 MED ORDER — PROPOFOL 10 MG/ML IV BOLUS
INTRAVENOUS | Status: DC | PRN
  Administered 2023-08-29: 200 mg via INTRAVENOUS
  Administered 2023-08-29: 20 mg via INTRAVENOUS

## 2023-08-29 MED ORDER — MIDAZOLAM HCL 5 MG/5ML IJ SOLN
INTRAMUSCULAR | Status: DC | PRN
  Administered 2023-08-29: 2 mg via INTRAVENOUS

## 2023-08-29 MED ORDER — FLUORESCEIN SODIUM 10 % IV SOLN
INTRAVENOUS | Status: DC | PRN
  Administered 2023-08-29: 25 mg via INTRAVENOUS

## 2023-08-29 MED ORDER — HYDROMORPHONE HCL 1 MG/ML IJ SOLN
0.2500 mg | INTRAMUSCULAR | Status: DC | PRN
Start: 2023-08-29 — End: 2023-08-29
  Administered 2023-08-29 (×3): 0.5 mg via INTRAVENOUS

## 2023-08-29 MED ORDER — HYDROMORPHONE HCL 1 MG/ML IJ SOLN
INTRAMUSCULAR | Status: AC
Start: 2023-08-29 — End: 2023-08-29
  Filled 2023-08-29: qty 1

## 2023-08-29 MED ORDER — ORAL CARE MOUTH RINSE: 15.0000 mL | Freq: Once | OROMUCOSAL | Status: AC

## 2023-08-29 MED ORDER — ONDANSETRON HCL 4 MG PO TABS: 4.0000 mg | ORAL_TABLET | Freq: Four times a day (QID) | ORAL | Status: DC | PRN

## 2023-08-29 MED ORDER — DEXTROSE IN LACTATED RINGERS 5 % IV SOLN
INTRAVENOUS | Status: AC
Start: 2023-08-29 — End: 2023-08-29

## 2023-08-29 MED ORDER — DEXMEDETOMIDINE HCL IN NACL 80 MCG/20ML IV SOLN
INTRAVENOUS | Status: DC | PRN
  Administered 2023-08-29: 16 ug via INTRAVENOUS
  Administered 2023-08-29: 4 ug via INTRAVENOUS
  Administered 2023-08-29: 12 ug via INTRAVENOUS

## 2023-08-29 MED ORDER — MEPERIDINE HCL 25 MG/ML IJ SOLN
6.2500 mg | INTRAMUSCULAR | Status: DC | PRN
Start: 2023-08-29 — End: 2023-08-29

## 2023-08-29 MED ORDER — LIDOCAINE 2% (20 MG/ML) 5 ML SYRINGE
INTRAMUSCULAR | Status: DC | PRN
  Administered 2023-08-29: 60 mg via INTRAVENOUS

## 2023-08-29 MED ORDER — SODIUM CHLORIDE 0.9 % IR SOLN
Status: DC | PRN
  Administered 2023-08-29: 1

## 2023-08-29 MED ORDER — EPHEDRINE 5 MG/ML INJ
INTRAVENOUS | Status: AC
Start: 2023-08-29 — End: ?
  Filled 2023-08-29: qty 5

## 2023-08-29 MED ORDER — SUCCINYLCHOLINE CHLORIDE 200 MG/10ML IV SOSY
PREFILLED_SYRINGE | INTRAVENOUS | Status: AC
  Filled 2023-08-29: qty 10

## 2023-08-29 MED ORDER — ROPIVACAINE HCL 5 MG/ML IJ SOLN
INTRAMUSCULAR | Status: AC
  Filled 2023-08-29: qty 30

## 2023-08-29 MED ORDER — ONDANSETRON HCL 4 MG/2ML IJ SOLN: 4.0000 mg | Freq: Once | INTRAMUSCULAR | Status: DC | PRN

## 2023-08-29 MED ORDER — OXYCODONE HCL 5 MG/5ML PO SOLN: 5.0000 mg | Freq: Once | ORAL | Status: DC | PRN

## 2023-08-29 MED ORDER — ROCURONIUM BROMIDE 10 MG/ML (PF) SYRINGE
PREFILLED_SYRINGE | INTRAVENOUS | Status: DC | PRN
  Administered 2023-08-29: 100 mg via INTRAVENOUS
  Administered 2023-08-29: 10 mg via INTRAVENOUS

## 2023-08-29 MED ORDER — BUPIVACAINE HCL (PF) 0.25 % IJ SOLN
INTRAMUSCULAR | Status: DC | PRN
  Administered 2023-08-29: 17 mL

## 2023-08-29 MED ORDER — KETOROLAC TROMETHAMINE 30 MG/ML IJ SOLN
INTRAMUSCULAR | Status: AC
  Filled 2023-08-29: qty 1

## 2023-08-29 MED ORDER — SCOPOLAMINE 1 MG/3DAYS TD PT72
1.0000 | MEDICATED_PATCH | TRANSDERMAL | Status: DC
  Administered 2023-08-29: 1.5 mg via TRANSDERMAL

## 2023-08-29 MED ORDER — MENTHOL 3 MG MT LOZG
1.0000 | LOZENGE | OROMUCOSAL | Status: DC | PRN
  Administered 2023-08-29: 3 mg via ORAL
  Filled 2023-08-29: qty 9

## 2023-08-29 MED ORDER — SIMETHICONE 80 MG PO CHEW
80.0000 mg | CHEWABLE_TABLET | Freq: Four times a day (QID) | ORAL | Status: DC | PRN
  Administered 2023-08-29: 80 mg via ORAL
  Filled 2023-08-29: qty 1

## 2023-08-29 MED ORDER — ONDANSETRON HCL 4 MG/2ML IJ SOLN
INTRAMUSCULAR | Status: AC
  Filled 2023-08-29: qty 2

## 2023-08-29 MED ORDER — MIDAZOLAM HCL 2 MG/2ML IJ SOLN
INTRAMUSCULAR | Status: AC
  Filled 2023-08-29: qty 2

## 2023-08-29 MED ORDER — FLUORESCEIN SODIUM 10 % IV SOLN
INTRAVENOUS | Status: AC
  Filled 2023-08-29: qty 5

## 2023-08-29 MED ORDER — SCOPOLAMINE 1 MG/3DAYS TD PT72
MEDICATED_PATCH | TRANSDERMAL | Status: AC
  Filled 2023-08-29: qty 1

## 2023-08-29 MED ORDER — KETOROLAC TROMETHAMINE 30 MG/ML IJ SOLN
INTRAMUSCULAR | Status: DC | PRN
  Administered 2023-08-29: 30 mg via INTRAVENOUS

## 2023-08-29 MED ORDER — ACETAMINOPHEN 500 MG PO TABS
1000.0000 mg | ORAL_TABLET | Freq: Four times a day (QID) | ORAL | Status: DC
  Administered 2023-08-29 – 2023-08-30 (×4): 1000 mg via ORAL
  Filled 2023-08-29 (×4): qty 2

## 2023-08-29 MED ORDER — HYDROMORPHONE HCL 1 MG/ML IJ SOLN
INTRAMUSCULAR | Status: DC | PRN
  Administered 2023-08-29 (×2): .5 mg via INTRAVENOUS

## 2023-08-29 MED ORDER — BUPIVACAINE HCL (PF) 0.25 % IJ SOLN
INTRAMUSCULAR | Status: AC
  Filled 2023-08-29: qty 30

## 2023-08-29 MED ORDER — FENTANYL CITRATE (PF) 250 MCG/5ML IJ SOLN
INTRAMUSCULAR | Status: AC
  Filled 2023-08-29: qty 5

## 2023-08-29 MED ORDER — LIDOCAINE 2% (20 MG/ML) 5 ML SYRINGE
INTRAMUSCULAR | Status: AC
  Filled 2023-08-29: qty 5

## 2023-08-29 MED ORDER — HYDROMORPHONE HCL 1 MG/ML IJ SOLN
INTRAMUSCULAR | Status: AC
  Filled 2023-08-29: qty 0.5

## 2023-08-29 MED ORDER — IBUPROFEN 600 MG PO TABS
600.0000 mg | ORAL_TABLET | Freq: Four times a day (QID) | ORAL | Status: DC
  Administered 2023-08-29 – 2023-08-30 (×4): 600 mg via ORAL
  Filled 2023-08-29: qty 3
  Filled 2023-08-29: qty 1
  Filled 2023-08-29 (×2): qty 3
  Filled 2023-08-29 (×4): qty 1

## 2023-08-29 SURGICAL SUPPLY — 48 items
APPLICATOR ARISTA FLEXITIP XL (MISCELLANEOUS) IMPLANT
CANISTER SUCT 3000ML PPV (MISCELLANEOUS) ×1 IMPLANT
CATH FOLEY 3WAY 5CC 16FR (CATHETERS) ×1 IMPLANT
COVER BACK TABLE 60X90IN (DRAPES) ×1 IMPLANT
COVER TIP SHEARS 8 DVNC (MISCELLANEOUS) ×1 IMPLANT
DEFOGGER SCOPE WARMER CLEARIFY (MISCELLANEOUS) ×1 IMPLANT
DERMABOND ADVANCED .7 DNX12 (GAUZE/BANDAGES/DRESSINGS) ×1 IMPLANT
DRAPE ARM DVNC X/XI (DISPOSABLE) ×4 IMPLANT
DRAPE COLUMN DVNC XI (DISPOSABLE) ×1 IMPLANT
DRAPE SURG IRRIG POUCH 19X23 (DRAPES) ×1 IMPLANT
DRAPE UTILITY XL STRL (DRAPES) ×1 IMPLANT
DRIVER NDL MEGA SUTCUT DVNCXI (INSTRUMENTS) ×1 IMPLANT
DRIVER NDLE MEGA SUTCUT DVNCXI (INSTRUMENTS) ×1 IMPLANT
DURAPREP 26ML APPLICATOR (WOUND CARE) ×1 IMPLANT
ELECTRODE REM PT RTRN 9FT ADLT (ELECTROSURGICAL) ×1 IMPLANT
FORCEPS BPLR LNG DVNC XI (INSTRUMENTS) ×1 IMPLANT
FORCEPS LONG TIP 8 DVNC XI (FORCEP) ×1 IMPLANT
GLOVE BIOGEL PI IND STRL 7.0 (GLOVE) ×5 IMPLANT
GLOVE ECLIPSE 6.5 STRL STRAW (GLOVE) ×3 IMPLANT
HEMOSTAT ARISTA ABSORB 3G PWDR (HEMOSTASIS) IMPLANT
HIBICLENS CHG 4% 4OZ BTL (MISCELLANEOUS) ×1 IMPLANT
IRRIGATION STRYKERFLOW (MISCELLANEOUS) ×1 IMPLANT
KIT PINK PAD W/HEAD ARE REST (MISCELLANEOUS) ×2 IMPLANT
KIT PINK PAD W/HEAD ARM REST (MISCELLANEOUS) ×2 IMPLANT
LEGGING LITHOTOMY PAIR STRL (DRAPES) ×1 IMPLANT
MARKER SKIN DUAL TIP RULER LAB (MISCELLANEOUS) IMPLANT
NDL INSUFFLATION 14GA 120MM (NEEDLE) ×1 IMPLANT
NEEDLE INSUFFLATION 14GA 120MM (NEEDLE) ×1 IMPLANT
OBTURATOR OPTICALSTD 8 DVNC (TROCAR) ×1 IMPLANT
OCCLUDER COLPOPNEUMO (BALLOONS) ×1 IMPLANT
PACK ROBOT WH (CUSTOM PROCEDURE TRAY) ×1 IMPLANT
PACK ROBOTIC GOWN (GOWN DISPOSABLE) ×1 IMPLANT
PAD OB MATERNITY 11 LF (PERSONAL CARE ITEMS) ×1 IMPLANT
SCISSORS MNPLR CVD DVNC XI (INSTRUMENTS) ×1 IMPLANT
SEAL UNIV 5-12 XI (MISCELLANEOUS) ×4 IMPLANT
SEALER VESSEL EXT DVNC XI (MISCELLANEOUS) ×1 IMPLANT
SET CYSTO W/LG BORE CLAMP LF (SET/KITS/TRAYS/PACK) IMPLANT
SET TRI-LUMEN FLTR TB AIRSEAL (TUBING) ×1 IMPLANT
SPIKE FLUID TRANSFER (MISCELLANEOUS) ×1 IMPLANT
SUT VIC AB 0 CT1 36 (SUTURE) ×2 IMPLANT
SUT VIC AB 4-0 PS2 27 (SUTURE) IMPLANT
SUT VICRYL 4-0 PS2 18IN ABS (SUTURE) ×2 IMPLANT
SUT VLOC 180 0 9IN GS21 (SUTURE) ×1 IMPLANT
TIP UTERINE 6.7X8CM BLUE DISP (MISCELLANEOUS) IMPLANT
TOWEL GREEN STERILE (TOWEL DISPOSABLE) ×1 IMPLANT
TROCAR PORT AIRSEAL 8X120 (TROCAR) ×1 IMPLANT
UNDERPAD 30X36 HEAVY ABSORB (UNDERPADS AND DIAPERS) ×1 IMPLANT
WATER STERILE IRR 1000ML POUR (IV SOLUTION) ×1 IMPLANT

## 2023-08-29 NOTE — Anesthesia Procedure Notes (Addendum)
 Procedure Name: Intubation Date/Time: 08/29/2023 7:44 AM  Performed by: Ezzie Holstein, CRNAPre-anesthesia Checklist: Patient identified, Emergency Drugs available, Suction available and Patient being monitored Patient Re-evaluated:Patient Re-evaluated prior to induction Oxygen Delivery Method: Circle System Utilized Preoxygenation: Pre-oxygenation with 100% oxygen Induction Type: IV induction Ventilation: Mask ventilation without difficulty Laryngoscope Size: Mac and 3 Grade View: Grade II Tube type: Oral Tube size: 7.0 mm Number of attempts: 1 Airway Equipment and Method: Stylet and Oral airway Placement Confirmation: ETT inserted through vocal cords under direct vision, positive ETCO2 and breath sounds checked- equal and bilateral Secured at: 22 cm Tube secured with: Tape Dental Injury: Teeth and Oropharynx as per pre-operative assessment  Comments: Intubation by Jacquenette Mattes, SRNA

## 2023-08-29 NOTE — H&P (Signed)
 Lauren Sweeney is an 33 y.o. female. [id number]  BF hx TL presents for surgical management of menorrhagia and dysmenorrhea. Pt is scheduled for davinci robotic total hysterectomy, bilateral salpingectomy  Pertinent Gynecological History: Menses:  menorrhagia Bleeding: menorrhagia Contraception: tubal ligation DES exposure: denies Blood transfusions: none Sexually transmitted diseases: no past history Previous GYN Procedures: DNC  Last mammogram:  n/Lauren  Date: n/Lauren Last pap: normal Date: 1/25 OB History: G7, P5   Menstrual History: Menarche age: n/Lauren Patient's last menstrual period was 08/22/2023 (exact date).    Past Medical History:  Diagnosis Date   DDD (degenerative disc disease), cervical    DDD (degenerative disc disease), lumbosacral    Dysmenorrhea    Menorrhagia     Past Surgical History:  Procedure Laterality Date   ANTERIOR CERVICAL DECOMP/DISCECTOMY FUSION  09/22/2018   [place];    C5--6   DECOMPRESSIVE LUMBAR LAMINECTOMY LEVEL 2  01/13/2019   [place];   L4-5 and L5-S1   TUBAL LIGATION N/Lauren 06/16/2017   Procedure: POST PARTUM TUBAL LIGATION;  Surgeon: Lauren Fruit, MD;  Location: [facility] BIRTHING SUITES;  Service: Gynecology;  Laterality: N/Lauren;    History reviewed. No pertinent family history.  Social History:  reports that she has never smoked. She has never used smokeless tobacco. She reports that she does not currently use alcohol. She reports that she does not use drugs.  Allergies: No Known Allergies  No medications prior to admission.    Review of Systems  All other systems reviewed and are negative.   Blood pressure 127/87, pulse 73, temperature 98.5 F (36.9 C), temperature source Oral, resp. rate 16, height 5' (1.524 m), weight 75.3 kg, last menstrual period 08/22/2023, SpO2 97%, not currently breastfeeding. Physical Exam Constitutional:      Appearance: Normal appearance.  Eyes:     Extraocular Movements: Extraocular movements intact.   Cardiovascular:     Rate and Rhythm: Regular rhythm.     Heart sounds: Normal heart sounds.  Genitourinary:    General: Normal vulva.     Comments: Vagina nl Uterus 12 wk size Adnexa nl Musculoskeletal:        General: Normal range of motion.  Skin:    General: Skin is warm and dry.  Neurological:     Mental Status: She is alert.  Psychiatric:        Mood and Affect: Mood normal.        Behavior: Behavior normal.     Results for orders placed or performed during the hospital encounter of 08/29/23 (from the past 24 hours)  Pregnancy, urine POC     Status: None   Collection Time: 08/29/23  6:13 AM  Result Value Ref Range   Preg Test, Ur NEGATIVE NEGATIVE    No results found.  Assessment/Plan: Menorrhagia Dysmenorrhea Uterine fibroids P) davinci robotic total hysterectomy, bilateral salpingectomy. Procedure explained. Risk of surgery reviewed including infection, bleeding, possible need for blood transfusion and its risk, poss need for surgery in the future for ovarian disease, injury to bladder, bowel ureter, thermal injury. All ? answered  Lauren Sweeney Lauren Sweeney 08/29/2023, 7:20 AM

## 2023-08-29 NOTE — Anesthesia Preprocedure Evaluation (Addendum)
 Anesthesia Evaluation  Patient identified by MRN, date of birth, ID band Patient awake    Reviewed: Allergy & Precautions, H&P , NPO status , Patient's Chart, lab work & pertinent test results  Airway Mallampati: IV  TM Distance: >3 FB Neck ROM: Full    Dental no notable dental hx.    Pulmonary neg pulmonary ROS   Pulmonary exam normal breath sounds clear to auscultation       Cardiovascular negative cardio ROS Normal cardiovascular exam Rhythm:Regular Rate:Normal     Neuro/Psych  negative psych ROS   GI/Hepatic negative GI ROS, Neg liver ROS,,,  Endo/Other  BMI 32  Renal/GU negative Renal ROS  negative genitourinary   Musculoskeletal negative musculoskeletal ROS (+)    Abdominal   Peds negative pediatric ROS (+)  Hematology Hb 12.3, plt 352   Anesthesia Other Findings   Reproductive/Obstetrics negative OB ROS                             Anesthesia Physical Anesthesia Plan  ASA: 2  Anesthesia Plan: General   Post-op Pain Management: Tylenol  PO (pre-op)*, Toradol  IV (intra-op)* and Precedex    Induction: Intravenous  PONV Risk Score and Plan: 4 or greater and Ondansetron , Dexamethasone , Midazolam , Scopolamine  patch - Pre-op and Treatment may vary due to age or medical condition  Airway Management Planned: Oral ETT  Additional Equipment: None  Intra-op Plan:   Post-operative Plan: Extubation in OR  Informed Consent:   Plan Discussed with:   Anesthesia Plan Comments:        Anesthesia Quick Evaluation

## 2023-08-29 NOTE — Anesthesia Postprocedure Evaluation (Signed)
 Anesthesia Post Note  Patient: Lauren Sweeney  Procedure(s) Performed: ROBOTIC ASSISTED TOTAL HYSTERECTOMY WITH SALPINGECTOMY, CYSTOSCOPY (Abdomen)     Patient location during evaluation: PACU Anesthesia Type: General Level of consciousness: awake and alert, oriented and patient cooperative Pain management: pain level controlled Vital Signs Assessment: post-procedure vital signs reviewed and stable Respiratory status: spontaneous breathing, nonlabored ventilation and respiratory function stable Cardiovascular status: blood pressure returned to baseline and stable Postop Assessment: no apparent nausea or vomiting Anesthetic complications: no   No notable events documented.  Last Vitals:  Vitals:   08/29/23 1115 08/29/23 1130  BP: (!) 143/87 129/89  Pulse: 71 66  Resp: 11 16  Temp:    SpO2: 100% 98%    Last Pain:  Vitals:   08/29/23 1147  TempSrc:   PainSc: 4                  Jacquelyne Matte

## 2023-08-29 NOTE — Brief Op Note (Signed)
 08/29/2023  10:26 AM  PATIENT:  Lauren Sweeney  33 y.o. female  PRE-OPERATIVE DIAGNOSIS:  Menorrhagia, dysmenorrhea Dysmenorrhea  POST-OPERATIVE DIAGNOSIS:  Menorrhagia, Dysmenorrhea  PROCEDURE:  Procedure(s): ROBOTIC ASSISTED TOTAL HYSTERECTOMY WITH SALPINGECTOMY, CYSTOSCOPY (N/A)  SURGEON:  Surgeons and Role:    * Ivery Marking, MD - Primary  PHYSICIAN ASSISTANT:   ASSISTANTS: Lori Rondo RNFA   ANESTHESIA:   general FINDINGS; enlarged uterus, surgical separated tubes, nl ovaries, appendix EBL:  50 mL   BLOOD ADMINISTERED:none  DRAINS: none   LOCAL MEDICATIONS USED:  MARCAINE      SPECIMEN:  Source of Specimen:  uterus with cervix, tubes  DISPOSITION OF SPECIMEN:  PATHOLOGY  COUNTS:  YES  TOURNIQUET:  * No tourniquets in log *  DICTATION: .Other Dictation: Dictation Number [id number]  PLAN OF CARE: Admit for overnight observation  PATIENT DISPOSITION:  PACU - hemodynamically stable.   Delay start of Pharmacological VTE agent (>24hrs) due to surgical blood loss or risk of bleeding: no

## 2023-08-29 NOTE — Op Note (Signed)
 Lauren Sweeney, Lauren Sweeney MEDICAL RECORD NO: [id number] ACCOUNT NO: [id number] DATE OF BIRTH: Jan 23, 1991 FACILITY: [facility] LOCATION: [facility] PHYSICIAN: Tynia Wiers A. Lesta Rater, MD  Operative Report   DATE OF PROCEDURE: 08/29/2023  PREOPERATIVE DIAGNOSES: 1.  Menorrhagia. 2.  Dysmenorrhea.   PROCEDURE:  Da Vinci robotic total hysterectomy, bilateral salpingectomy, cystoscopy.  POSTOPERATIVE DIAGNOSES: 1.  Menorrhagia. 2.  Dysmenorrhea.   ANESTHESIA:  General.  SURGEON:  Carliyah Cotterman A. Lesta Rater, MD  ASSISTANT:  Dorene Gang, RNFA.  DESCRIPTION OF PROCEDURE:  Under adequate general anesthesia, the patient was placed in the dorsal lithotomy position.  She was positioned for robotic surgery.  The patient had a prior tummy tuck.  Bimanual examination under anesthesia revealed about a  10-week sized uterus.  No adnexal masses could be appreciated.  A Foley catheter was placed.  A weighted speculum was placed in the vagina.  A Sims retractor was placed anteriorly.  The cervix was large and parous.  0 Vicryl figure-of-eight sutures were  placed on the anterior and posterior lip of the cervix.  The uterus sounded to 9 cm.  A large Rumi cup with a #8 uterine manipulator was introduced into the uterus without incident.  The retractors were removed.  Attention was then turned to the abdomen.   0.25% Marcaine  was injected supraumbilically.  A small vertical incision was made.  Veress needle was introduced and tested several times with sterile water. Due to the patient's taut abdomen from her previous abdominoplasty only 1.3 L of CO2 was insufflated.   The robotic port was then introduced.  The  robotic camera was then placed, confirming entry to the abdomen without incident.  The patient then had a high insufflation started.  Additional port sites were then placed in the lower position due to left upper quadrant  adhesion.  In the far left, the incision was then made under direct visualization.  A robotic  port was placed in between that site and the camera site.  An 8-mm air seal was placed and on the right, two additional port sites were placed.  The robot was  then docked.  The patient was placed in the deep Trendelenburg position.  The pelvis was noted.  The appendix was seen.  The robot was docked and instruments.  The vessel sealer was placed in arm #1.  Arm #3 was the long bipolar forceps and arm #4 was  the monopolar scissors.  I then went to the surgical console.  Both ureters were identified.  The patient had prior tubal separation.  The procedure was started on the left with removal of the segmented fallopian tube followed by opening of the left  retroperitoneal space and the left uteroovarian ligaments were serially clamped, cauterized, and cut.  The round ligaments were also clamped, cauterized, and cut.  The vesicouterine peritoneum was opened transversely.  The uterine vessels was cauterized,  clamped, and then cut.  There were multiple varicosities on the left side of the pelvis.  On the contralateral side, the same procedure was performed.  There were periuterine adhesions that were removed.  The periovarian adhesions were also removed.   The bladder was displaced sharply inferiorly.  The posterior opening was done above the Rumi cup posteriorly and then anteriorly and the uterine vessels were subsequently; the angle was serially clamped, cauterized, and cut bilaterally.  The specimen was  removed.  There was a large vessel that appeared to be a vessel that was noted on the left, which was probably in conjunction with the  varicosities that were noted.  Nonetheless, intravenous dye was injected to check for possible ureter injury .  The ureter was  seen peristalsing well below the area subsequently.  No egress urine was noted.  The site was cauterized.  The remaining area of the vaginal cuff was cauterized for bleeding vessels.  The instruments were switched out to the long tip forceps for the   vessel sealer and the large mega needle suture driver for the scissors.  Using the two 0 V-Loc suture, the vaginal cuff was closed x2.  The vaginal cuff was inspected intraoperatively with good approximation noted.  I then went to do a cystoscopy with a  70-degree angle scope.  Entry into the bladder was without incident.  Both ureters were briskly egressing urine.  The cystoscope was removed.  The Foley catheter was reinserted sterilely.  Prior to leaving the console, the intra-abdominal pressure had been decreased to 8 mm Hg.   Additional vessels for bleeding were looked for and any seen were cauterized.  The abdomen was irrigated and suctioned the debris.  Arista potato starch was placed overlying the vaginal cuff.  The instruments were removed under direct visualization.  The  abdomen was deflated.  The ports were removed.  The robot was undocked and the incision was closed with 4-0 Vicryl subcuticular closure.  SPECIMEN:  Uterus with cervix and fallopian tubes sent to pathology.  Estimated weight was 210 grams.  INTRAOPERATIVE FLUIDS:  600 mL.  URINE OUTPUT:  150 mL.  ESTIMATED BLOOD LOSS: 50 ml  COMPLICATIONS:  None.  DISPOSITION:  The patient tolerated the procedure well.  She was transferred to the recovery room in stable condition.   PUS D: 08/29/2023 10:38:05 am T: 08/29/2023 1:59:00 pm  JOB: [id number]/ [id number]

## 2023-08-29 NOTE — Transfer of Care (Signed)
 Immediate Anesthesia Transfer of Care Note  Patient: Connor Foxworthy  Procedure(s) Performed: ROBOTIC ASSISTED TOTAL HYSTERECTOMY WITH SALPINGECTOMY, CYSTOSCOPY (Abdomen)  Patient Location: PACU  Anesthesia Type:General  Level of Consciousness: awake and alert   Airway & Oxygen Therapy: Patient connected to face mask oxygen  Post-op Assessment: Report given to RN and Post -op Vital signs reviewed and stable  Post vital signs: Reviewed and stable  Last Vitals:  Vitals Value Taken Time  BP 132/60 08/29/23 1039  Temp    Pulse 69 08/29/23 1043  Resp 24 08/29/23 1043  SpO2 100 % 08/29/23 1043  Vitals shown include unfiled device data.  Last Pain:  Vitals:   08/29/23 0615  TempSrc: Oral  PainSc: 0-No pain      Patients Stated Pain Goal: 4 (08/29/23 0615)  Complications: No notable events documented.

## 2023-08-29 NOTE — Plan of Care (Signed)

## 2023-08-30 ENCOUNTER — Encounter (HOSPITAL_COMMUNITY): Payer: Self-pay | Admitting: Obstetrics and Gynecology

## 2023-08-30 DIAGNOSIS — N946 Dysmenorrhea, unspecified: Secondary | ICD-10-CM | POA: Diagnosis not present

## 2023-08-30 DIAGNOSIS — N888 Other specified noninflammatory disorders of cervix uteri: Secondary | ICD-10-CM | POA: Diagnosis not present

## 2023-08-30 DIAGNOSIS — N8003 Adenomyosis of the uterus: Secondary | ICD-10-CM | POA: Diagnosis not present

## 2023-08-30 DIAGNOSIS — N920 Excessive and frequent menstruation with regular cycle: Secondary | ICD-10-CM | POA: Diagnosis not present

## 2023-08-30 LAB — CBC
HCT: 36.8 % (ref 36.0–46.0)
Hemoglobin: 11.7 g/dL — ABNORMAL LOW (ref 12.0–15.0)
MCH: 26.8 pg (ref 26.0–34.0)
MCHC: 31.8 g/dL (ref 30.0–36.0)
MCV: 84.4 fL (ref 80.0–100.0)
Platelets: 364 10*3/uL (ref 150–400)
RBC: 4.36 MIL/uL (ref 3.87–5.11)
RDW: 13.6 % (ref 11.5–15.5)
WBC: 16.8 10*3/uL — ABNORMAL HIGH (ref 4.0–10.5)
nRBC: 0 % (ref 0.0–0.2)

## 2023-08-30 LAB — BASIC METABOLIC PANEL WITH GFR
Anion gap: 13 (ref 5–15)
BUN: 7 mg/dL (ref 6–20)
CO2: 22 mmol/L (ref 22–32)
Calcium: 9.1 mg/dL (ref 8.9–10.3)
Chloride: 103 mmol/L (ref 98–111)
Creatinine, Ser: 0.85 mg/dL (ref 0.44–1.00)
GFR, Estimated: >60 mL/min (ref >60)
Glucose, Bld: 118 mg/dL — ABNORMAL HIGH (ref 70–99)
Potassium: 4.1 mmol/L (ref 3.5–5.1)
Sodium: 138 mmol/L (ref 135–145)

## 2023-08-30 MED ORDER — OXYCODONE HCL 5 MG PO TABS: 5.0000 mg | ORAL_TABLET | ORAL | 0 refills | Status: AC | PRN

## 2023-08-30 MED ORDER — IBUPROFEN 600 MG PO TABS: 600.0000 mg | ORAL_TABLET | Freq: Four times a day (QID) | ORAL | 1 refills | Status: AC | PRN

## 2023-08-30 NOTE — Discharge Instructions (Signed)
Call if temperature greater than equal to 100.4, nothing per vagina for 4-6 weeks or severe nausea vomiting, increased incisional pain , drainage or redness in the incision site, no straining with bowel movements, showers no bath °

## 2023-08-30 NOTE — Progress Notes (Signed)
 Subjective: Patient reports {sub:3041132}.    Objective: I have reviewed patient's vital signs.  {reviewed:3041135}. Vitals:   08/29/23 2125 08/30/23 0558  BP: 118/77 114/64  Pulse: 71 67  Resp: 18 19  Temp: 98 F (36.7 C) 98 F (36.7 C)  SpO2: 100% 100%   I/O last 3 completed shifts: In: 700 [I.V.:700] Out: 1575 [Urine:1525; Blood:50] No intake/output data recorded.  Lab Results  Component Value Date   WBC 16.8 (H) 08/30/2023   HGB 11.7 (L) 08/30/2023   HCT 36.8 08/30/2023   MCV 84.4 08/30/2023   PLT 364 08/30/2023   Lab Results  Component Value Date   CREATININE 0.85 08/30/2023    EXAM General: {Exam; general:16600} Resp: {Exam; lung:16931} Cardio: {Exam; heart:5510} GI: {Exam, ZO:1096045} Extremities: {Exam; extremity:5109} Vaginal Bleeding: {exam; vaginal bleeding:3041122}  Assessment: s/p Procedure(s): ROBOTIC ASSISTED TOTAL HYSTERECTOMY WITH SALPINGECTOMY, CYSTOSCOPY: {assessment details:3041134}  Plan: {WUJW:1191478}  LOS: 0 days    Kandra Orn, MD 08/30/2023 7:58 AM    08/30/2023, 7:58 AM

## 2023-08-30 NOTE — Discharge Summary (Signed)
 Physician Discharge Summary  Patient ID: Lauren Sweeney MRN: [id number] DOB/AGE: 05/29/1990 33 y.o.  Admit date: 08/29/2023 Discharge date: 08/30/2023  Admission Diagnoses:  Discharge Diagnoses:  Principal Problem:   Menorrhagia Active Problems:   S/P hysterectomy   Discharged Condition: {condition:18240}  Hospital Course: ***  Consults: {consultation:18241}  Significant Diagnostic Studies: {diagnostics:18242}  Treatments: {Tx:18249}  Discharge Exam: Blood pressure 114/64, pulse 67, temperature 98 F (36.7 C), temperature source Oral, resp. rate 19, height 5' (1.524 m), weight 75.3 kg, last menstrual period 08/22/2023, SpO2 100%, not currently breastfeeding. {physical DUKG:2542706}  Disposition: Discharge disposition: 01-Home or Self Care       Discharge Instructions     Call MD for:  persistant dizziness or light-headedness   Complete by: As directed    Call MD for:  persistant nausea and vomiting   Complete by: As directed    Call MD for:  severe uncontrolled pain   Complete by: As directed    Call MD for:  temperature >100.4   Complete by: As directed    Diet general   Complete by: As directed    Increase activity slowly   Complete by: As directed       Allergies as of 08/30/2023   No Known Allergies      Medication List     STOP taking these medications    misoprostol 200 MCG tablet Commonly known as: CYTOTEC   phenazopyridine 200 MG tablet Commonly known as: PYRIDIUM       TAKE these medications    ibuprofen  600 MG tablet Commonly known as: ADVIL  Take 1 tablet (600 mg total) by mouth every 6 (six) hours as needed.   oxyCODONE  5 MG immediate release tablet Commonly known as: Oxy IR/ROXICODONE  Take 1 tablet (5 mg total) by mouth every 4 (four) hours as needed for up to 7 days for moderate pain (pain score 4-6).        Follow-up Information     Ivery Marking, MD Follow up in 2 week(s).   Specialty: Obstetrics and  Gynecology Contact information: [address] [place] [address] [phone]                 Signed: Kandra Orn 08/30/2023, 8:02 AM

## 2023-09-01 LAB — SURGICAL PATHOLOGY

## 2023-09-30 DIAGNOSIS — Z4889 Encounter for other specified surgical aftercare: Secondary | ICD-10-CM | POA: Diagnosis not present
